# Patient Record
Sex: Male | Born: 1985 | Race: White | Hispanic: No | Marital: Married | Smoking: Former smoker
Health system: Southern US, Community
[De-identification: ages and names within clinical notes are randomized; demographics above are authoritative.]

## PROBLEM LIST (undated history)

## (undated) ENCOUNTER — Ambulatory Visit (HOSPITAL_COMMUNITY): Discharge status: Absent without leave | Payer: 59

## (undated) DIAGNOSIS — Z87891 Personal history of nicotine dependence: Secondary | ICD-10-CM

## (undated) HISTORY — DX: Personal history of nicotine dependence: Z87.891

## (undated) HISTORY — PX: TONSILLECTOMY: SUR1361

---

## 2008-11-28 ENCOUNTER — Encounter: Payer: Self-pay | Admitting: Family Medicine

## 2008-12-20 ENCOUNTER — Encounter: Payer: Self-pay | Admitting: Family Medicine

## 2009-05-10 ENCOUNTER — Emergency Department (HOSPITAL_COMMUNITY): Admission: EM | Admit: 2009-05-10 | Discharge: 2009-05-10 | Payer: Self-pay | Admitting: Emergency Medicine

## 2009-08-06 ENCOUNTER — Ambulatory Visit: Payer: Self-pay | Admitting: Family Medicine

## 2009-08-06 DIAGNOSIS — Z87891 Personal history of nicotine dependence: Secondary | ICD-10-CM

## 2009-08-06 HISTORY — DX: Personal history of nicotine dependence: Z87.891

## 2010-03-04 NOTE — Assessment & Plan Note (Signed)
Summary: To Establish//cb   Vital Signs:  Patient profile:   25 year old male Height:      71 inches (180.34 cm) Weight:      211 pounds (95.91 kg) BMI:     29.53 O2 Sat:      98 % on Room air Temp:     98.4 degrees F (36.89 degrees C) oral Pulse rate:   56 / minute BP sitting:   110 / 76  (left arm) Cuff size:   large  Vitals Entered By: Josph Macho RMA (August 06, 2009 10:11 AM)  O2 Flow:  Room air CC: Establish new pt/ CF Is Patient Diabetic? No   History of Present Illness: Here to establish care.  PMH reviewed.  Hx tonsillectomy in childhood.  No other surgery. Allergy to Ceclor.  No chronic med probems.  Recent tetanus earlier this year.  Works for [employer].  Has yearly physicals through them.  recent labs reviewed and normal. Normal Audiogram last Fall.  Needs permit for concealed gun signed.  No hx of depression or any mental health issues. No hx of drug addiction.  FH and SH reviewed.  Smokes < 1/2 pack cigs/day.  Preventive Screening-Counseling & Management  Alcohol-Tobacco     Smoking Status: current  Current Medications (verified): 1)  None  Allergies (verified): 1)  ! Ceclor  Past History:  Family History: Last updated: 08/06/2009 Family History Hypertension MGF  CAD. Father Crohn's Disease.  Social History: Last updated: 08/06/2009 Occupation:  IT sales professional Single Current Smoker Alcohol use-yes  Risk Factors: Smoking Status: current (08/06/2009)  Past Medical History: no chronic med problems.  Past Surgical History: Tonsillectomy PMH-FH-SH reviewed for relevance  Family History: Family History Hypertension MGF  CAD. Father Crohn's Disease.  Social History: Occupation:  Programme researcher, broadcasting/film/video Current Smoker Alcohol use-yes Occupation:  employed Smoking Status:  current  Review of Systems  The patient denies anorexia, fever, weight loss, weight gain, vision loss, decreased hearing, hoarseness, chest pain, syncope,  dyspnea on exertion, peripheral edema, prolonged cough, headaches, hemoptysis, abdominal pain, melena, hematochezia, severe indigestion/heartburn, hematuria, incontinence, genital sores, muscle weakness, suspicious skin lesions, transient blindness, difficulty walking, depression, unusual weight change, abnormal bleeding, enlarged lymph nodes, and testicular masses.    Physical Exam  General:  Well-developed,well-nourished,in no acute distress; alert,appropriate and cooperative throughout examination Head:  Normocephalic and atraumatic without obvious abnormalities. No apparent alopecia or balding. Ears:  External ear exam shows no significant lesions or deformities.  Otoscopic examination reveals clear canals, tympanic membranes are intact bilaterally without bulging, retraction, inflammation or discharge. Hearing is grossly normal bilaterally. Mouth:  Oral mucosa and oropharynx without lesions or exudates.  Teeth in good repair. Neck:  No deformities, masses, or tenderness noted. Lungs:  Normal respiratory effort, chest expands symmetrically. Lungs are clear to auscultation, no crackles or wheezes. Heart:  Normal rate and regular rhythm. S1 and S2 normal without gallop, murmur, click, rub or other extra sounds. Abdomen:  Bowel sounds positive,abdomen soft and non-tender without masses, organomegaly or hernias noted. Neurologic:  alert & oriented X3, cranial nerves II-XII intact, and gait normal.   Skin:  no rashes and no suspicious lesions.   Psych:  normally interactive, good eye contact, not anxious appearing, and not depressed appearing.     Impression & Recommendations:  Problem # 1:  PERS HX TOBACCO USE PRESENTING HAZARDS HEALTH (ICD-V15.82) pt counseled on need to stop tobacco use though currently low volume use.  Patient Instructions: 1)  Please schedule a  follow-up appointment as needed .    Preventive Care Screening  Last Tetanus Booster:    Date:  06/02/2009    Results:   Historical     Preventive Care Screening  Last Tetanus Booster:    Date:  06/02/2009    Results:  Historical

## 2010-03-04 NOTE — Therapy (Signed)
Summary: Audiogram Analysis/[facility]  Audiogram Analysis/[facility]   Imported By: Maryln Gottron 08/08/2009 11:20:19  _____________________________________________________________________  External Attachment:    Type:   Image     Comment:   External Document

## 2010-11-28 ENCOUNTER — Encounter: Payer: Self-pay | Admitting: Family Medicine

## 2010-11-28 ENCOUNTER — Ambulatory Visit (INDEPENDENT_AMBULATORY_CARE_PROVIDER_SITE_OTHER): Payer: 59 | Admitting: Family Medicine

## 2010-11-28 VITALS — BP 120/80 | Temp 98.1°F | Wt 214.0 lb

## 2010-11-28 DIAGNOSIS — F17200 Nicotine dependence, unspecified, uncomplicated: Secondary | ICD-10-CM

## 2010-11-28 MED ORDER — BUPROPION HCL ER (SR) 150 MG PO TB12: 150.0000 mg | ORAL_TABLET | Freq: Two times a day (BID) | ORAL | Status: DC

## 2010-11-28 NOTE — Patient Instructions (Signed)
Start Zyban one daily for one week then increase to one twice daily Stop smoking within 2 weeks after starting medication.

## 2010-11-28 NOTE — Progress Notes (Signed)
  Subjective:    Patient ID: Luis Villa, male    DOB: [birth year], 25 y.o.   MRN: [id number]  HPI  Here to discuss smoking cessation. Patient currently smokes about one pack of cigarettes per day. Has previously tried nicotine patches without much success. He is very resolute in quitting at this time. Has smoked for approximately 10 years. Works as a IT sales professional.   Wants to consider either Chantix or Zyban.  He has some reservation about risk of depression with Chantix.   Review of Systems  Respiratory: Negative for cough and shortness of breath.   Cardiovascular: Negative for chest pain.       Objective:   Physical Exam  Constitutional: He appears well-developed and well-nourished.  Cardiovascular: Normal rate and regular rhythm.   Pulmonary/Chest: Effort normal and breath sounds normal. No respiratory distress. He has no wheezes. He has no rales.          Assessment & Plan:  Smoking cessation. Discussed options with patient. To try Wellbutrin 150 mg twice daily. We'll start with once daily for one week then titrate to twice daily. Quit smoking within 2 weeks after starting. Discussed importance of picking a quit date.

## 2012-03-03 ENCOUNTER — Encounter (HOSPITAL_COMMUNITY): Payer: Self-pay | Admitting: Emergency Medicine

## 2012-03-03 ENCOUNTER — Emergency Department (HOSPITAL_COMMUNITY)
Admission: EM | Admit: 2012-03-03 | Discharge: 2012-03-03 | Disposition: A | Discharge status: Home / Self Care | Payer: Worker's Compensation | Attending: Emergency Medicine | Admitting: Emergency Medicine

## 2012-03-03 DIAGNOSIS — W208XXA Other cause of strike by thrown, projected or falling object, initial encounter: Secondary | ICD-10-CM | POA: Insufficient documentation

## 2012-03-03 DIAGNOSIS — S0990XA Unspecified injury of head, initial encounter: Secondary | ICD-10-CM | POA: Insufficient documentation

## 2012-03-03 DIAGNOSIS — Y9389 Activity, other specified: Secondary | ICD-10-CM | POA: Insufficient documentation

## 2012-03-03 DIAGNOSIS — Y929 Unspecified place or not applicable: Secondary | ICD-10-CM | POA: Insufficient documentation

## 2012-03-03 DIAGNOSIS — F17200 Nicotine dependence, unspecified, uncomplicated: Secondary | ICD-10-CM | POA: Insufficient documentation

## 2012-03-03 MED ORDER — ACETAMINOPHEN 325 MG PO TABS
650.0000 mg | ORAL_TABLET | Freq: Once | ORAL | Status: AC
  Administered 2012-03-03: 650 mg via ORAL
  Filled 2012-03-03: qty 2

## 2012-03-03 NOTE — ED Notes (Signed)
MD at bedside. [place]ver[place] MD 
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
## 2012-06-25 ENCOUNTER — Emergency Department (INDEPENDENT_AMBULATORY_CARE_PROVIDER_SITE_OTHER)
Admission: EM | Admit: 2012-06-25 | Discharge: 2012-06-25 | Disposition: A | Discharge status: Home / Self Care | Payer: Worker's Compensation | Source: Home / Self Care | Attending: Emergency Medicine | Admitting: Emergency Medicine

## 2012-06-25 ENCOUNTER — Emergency Department (INDEPENDENT_AMBULATORY_CARE_PROVIDER_SITE_OTHER): Payer: Worker's Compensation

## 2012-06-25 ENCOUNTER — Encounter (HOSPITAL_COMMUNITY): Payer: Self-pay | Admitting: Emergency Medicine

## 2012-06-25 DIAGNOSIS — IMO0002 Reserved for concepts with insufficient information to code with codable children: Secondary | ICD-10-CM

## 2012-06-25 DIAGNOSIS — S61409A Unspecified open wound of unspecified hand, initial encounter: Secondary | ICD-10-CM

## 2012-06-25 DIAGNOSIS — T148XXA Other injury of unspecified body region, initial encounter: Secondary | ICD-10-CM

## 2012-06-25 DIAGNOSIS — S61411A Laceration without foreign body of right hand, initial encounter: Secondary | ICD-10-CM

## 2012-06-25 MED ORDER — OXYCODONE-ACETAMINOPHEN 5-325 MG PO TABS: ORAL_TABLET | ORAL | Status: DC

## 2012-06-25 MED ORDER — ONDANSETRON 4 MG PO TBDP
ORAL_TABLET | ORAL | Status: AC
  Filled 2012-06-25: qty 1

## 2012-06-25 MED ORDER — HYDROCODONE-ACETAMINOPHEN 5-325 MG PO TABS
ORAL_TABLET | ORAL | Status: AC
  Filled 2012-06-25: qty 2

## 2012-06-25 MED ORDER — HYDROMORPHONE HCL PF 1 MG/ML IJ SOLN
INTRAMUSCULAR | Status: AC
  Filled 2012-06-25: qty 2

## 2012-06-25 MED ORDER — HYDROCODONE-ACETAMINOPHEN 5-325 MG PO TABS
2.0000 | ORAL_TABLET | Freq: Once | ORAL | Status: AC
  Administered 2012-06-25: 2 via ORAL

## 2012-06-25 MED ORDER — ONDANSETRON HCL 4 MG/2ML IJ SOLN: 4.0000 mg | Freq: Once | INTRAMUSCULAR | Status: DC

## 2012-06-25 MED ORDER — CEPHALEXIN 500 MG PO CAPS: 500.0000 mg | ORAL_CAPSULE | Freq: Three times a day (TID) | ORAL | Status: DC

## 2012-06-25 MED ORDER — HYDROMORPHONE HCL PF 1 MG/ML IJ SOLN: 2.0000 mg | Freq: Once | INTRAMUSCULAR | Status: DC
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
## 2012-06-25 NOTE — Progress Notes (Signed)
Orthopedic Tech Progress Note Patient Details:  Calton Harshfield Feb 24, 1985 [id number]  Ortho Devices Type of Ortho Device: Ace wrap;Volar splint Ortho Device/Splint Location: right hand Ortho Device/Splint Interventions: Application   Nikki Dom 06/25/2012, 4:43 PM

## 2012-06-25 NOTE — ED Notes (Signed)
Ortho tech currently in room 3.  Patient made aware

## 2012-06-25 NOTE — ED Notes (Addendum)
Pt report laceration to right hand about 20 min ago. Pt states that during training he put his hand through a window. Wound is dressed and bleeding is controlled. Laceration to posterior port of hand above wrist.

## 2013-09-26 IMAGING — CR DG HAND COMPLETE 3+V*R*
3 series · 3 of 3 positions shown · non-contrast
Comparison: None.

CLINICAL DATA: Extremity laceration.  Pain and through window.
Laceration at the base of the fifth metacarpal.  Unable to
straighten fingers secondary to tendon damage.  Rule out foreign
body.

RIGHT HAND - COMPLETE 3+ VIEW

[view not recorded (1 of 3)]
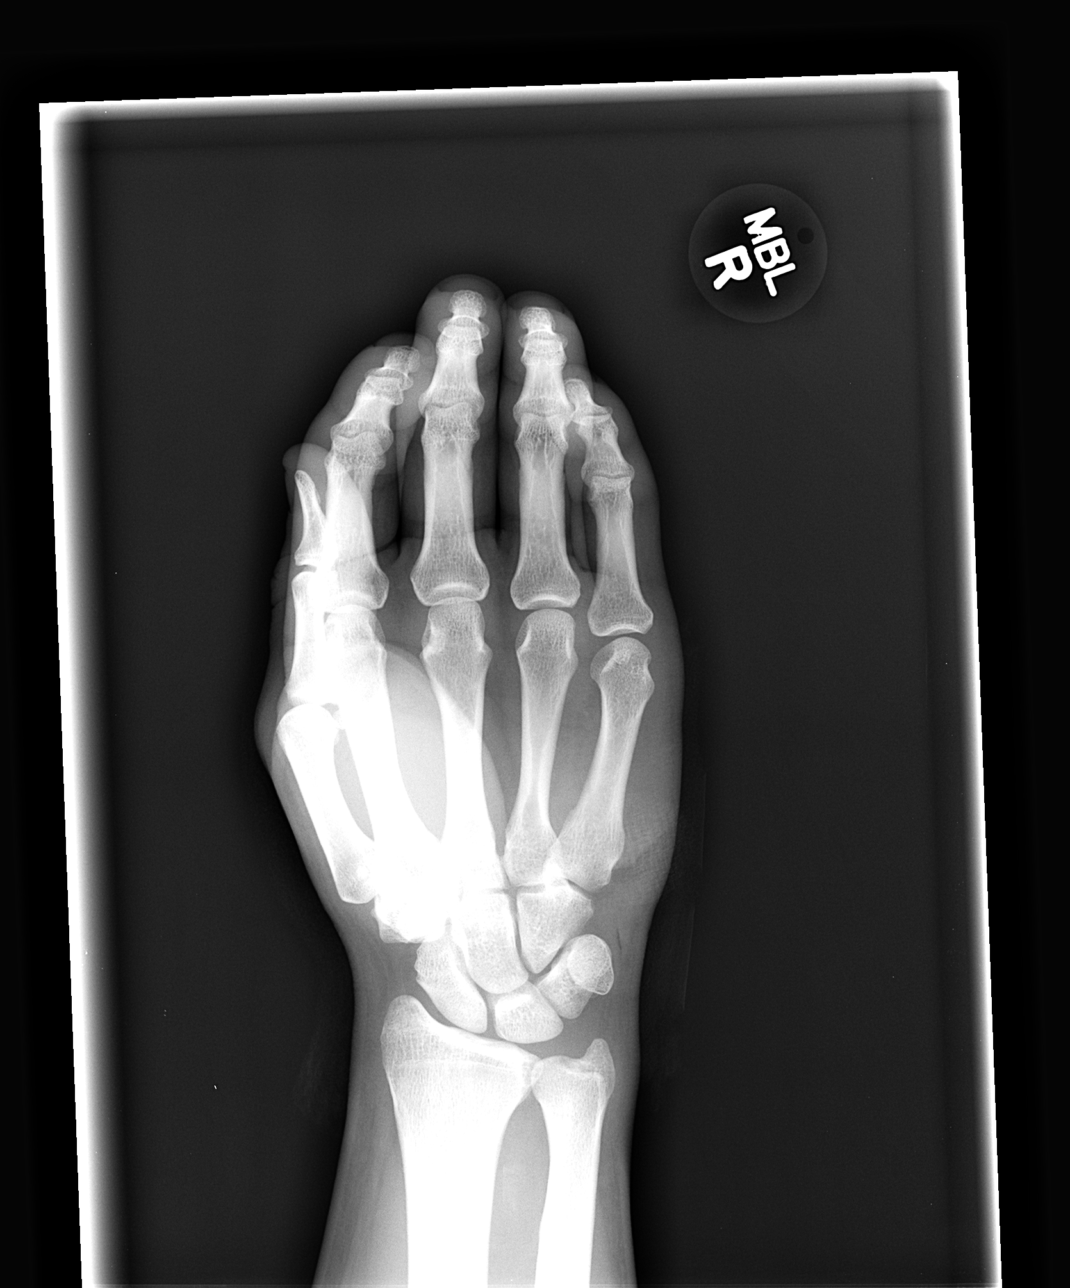

[view not recorded (2 of 3)]
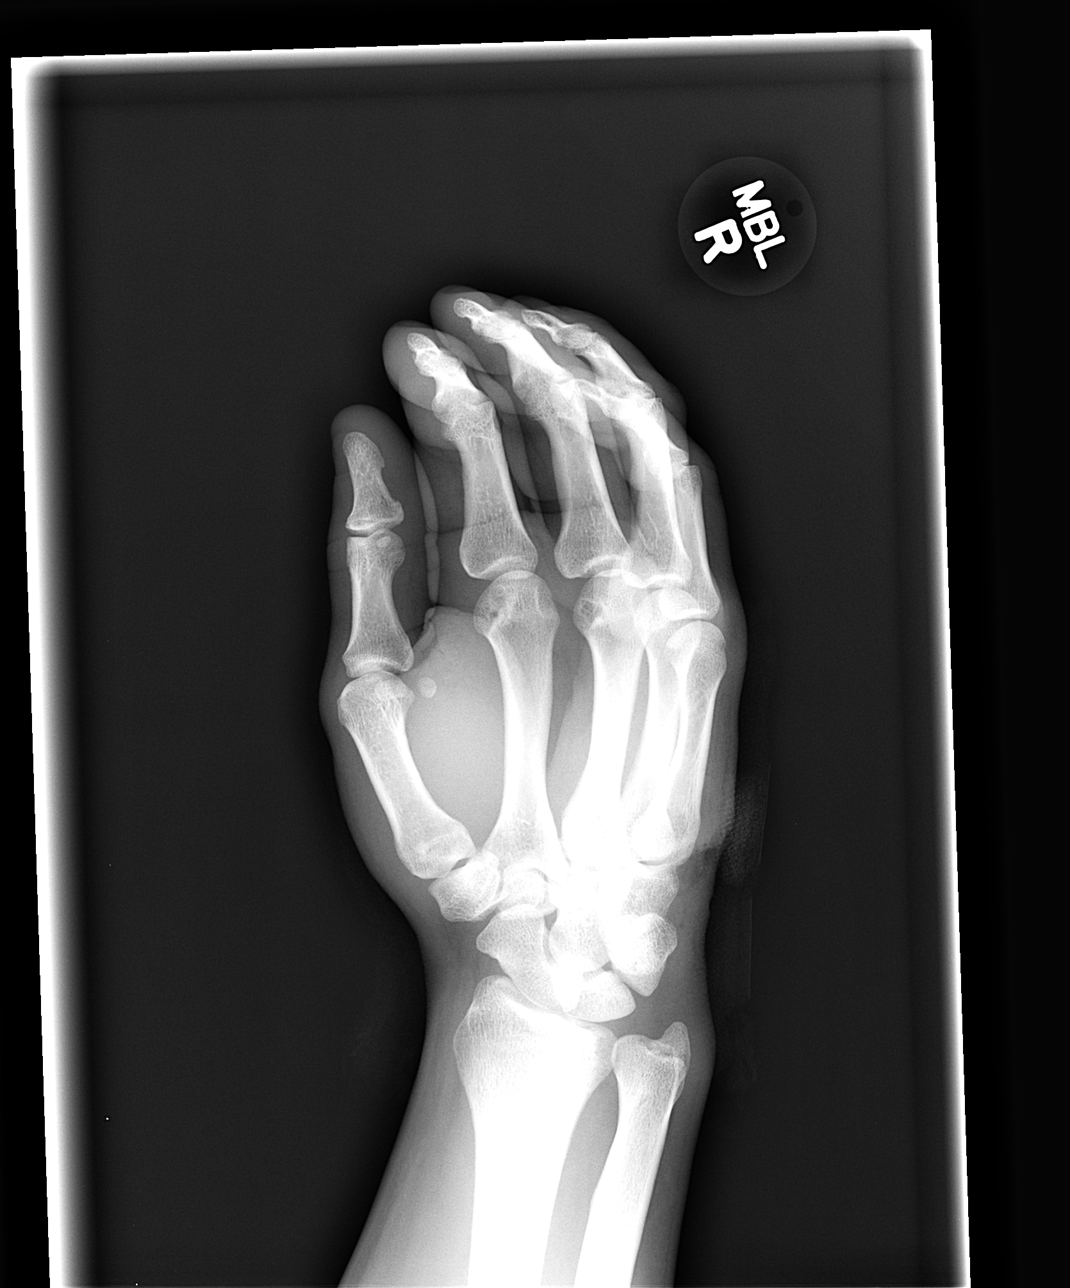

[view not recorded (3 of 3)]
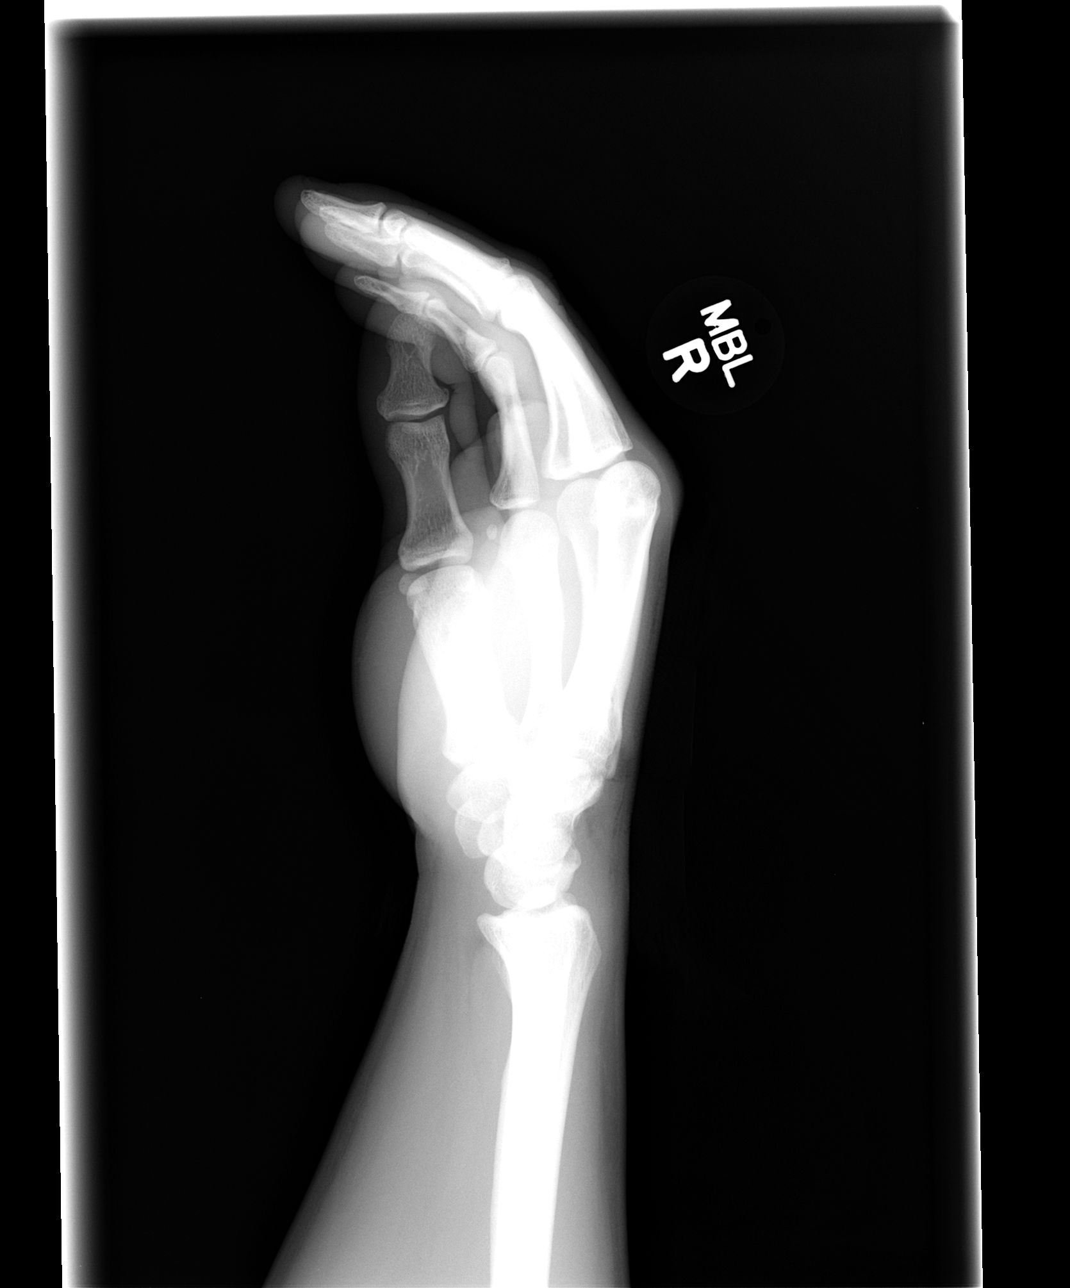

[3 of 3 positions shown; findings below may reference images not displayed]

FINDINGS: There is no evidence for acute fracture or subluxation.
No radiopaque foreign body.  There is soft tissue gas along the
posterior aspect of the fifth carpometacarpal joint.  Dressing
material is seen in this region.
IMPRESSION: 1.  Posterior laceration.
2.  No evidence for acute fracture.

## 2013-09-28 ENCOUNTER — Encounter: Payer: Self-pay | Admitting: Family Medicine

## 2013-09-28 ENCOUNTER — Ambulatory Visit (INDEPENDENT_AMBULATORY_CARE_PROVIDER_SITE_OTHER): Payer: 59 | Admitting: Family Medicine

## 2013-09-28 VITALS — BP 132/80 | HR 60 | Wt 221.0 lb

## 2013-09-28 DIAGNOSIS — L600 Ingrowing nail: Secondary | ICD-10-CM

## 2013-09-28 NOTE — Patient Instructions (Signed)
Keep toe dry for today Starting tomorrow, clean daily with soap and water Use topical antibiotic daily for next 2-3 days Follow up for signs of infection such as redness or drainage.

## 2013-09-28 NOTE — Progress Notes (Signed)
Pre visit review using our clinic review tool, if applicable. No additional management support is needed unless otherwise documented below in the visit note. 

## 2013-09-28 NOTE — Progress Notes (Addendum)
   Subjective:    Patient ID: Luis Villa, male    DOB: 23-Mar-1985, 28 y.o.   MRN: [id number]  HPI  ingrown right great toenail. Symptomatic for about one year..  he does also angulating as a IT sales professional. This is becoming daily painful. Try warm soaks without improvement. He would like to have some sort of intervention. No fevers or chills.  Past Medical History  Diagnosis Date  . PERS HX TOBACCO USE PRESENTING HAZARDS HEALTH 08/06/2009   Past Surgical History  Procedure Laterality Date  . Tonsillectomy      reports that he has quit smoking. His smoking use included Cigarettes. He has a 10 pack-year smoking history. He does not have any smokeless tobacco history on file. He reports that he does not use illicit drugs. His alcohol history is not on file. family history includes Crohn's disease in his father; Heart disease in his maternal grandfather; Hypertension in his other. Allergies  Allergen Reactions  . Cefaclor     Ceclor= hives     Review of Systems  Constitutional: Negative for fever and chills.       Objective:   Physical Exam  Constitutional: He appears well-developed and well-nourished.  Cardiovascular: Normal rate and regular rhythm.   Skin:  Right great toe reveals ingrown border next is second toe. He has some inflammation and no granulation tissue. Mild erythema          Assessment & Plan:  Ingrown right great nail. We discussed risk and benefits of partial excision. Patient consented. Digital block with 1% plain Xylocaine. After achieving block prepped with Betadine. Using straight hemostats freed up nail border. Removed about one third of nail with surgical scissors without difficulty. Minimal bleeding. Antibiotic and dressing applied. Wound care instruction given

## 2014-07-13 ENCOUNTER — Ambulatory Visit (INDEPENDENT_AMBULATORY_CARE_PROVIDER_SITE_OTHER): Payer: 59 | Admitting: Family Medicine

## 2014-07-13 ENCOUNTER — Encounter: Payer: Self-pay | Admitting: Family Medicine

## 2014-07-13 VITALS — BP 100/60 | HR 60 | Temp 98.3°F | Wt 223.7 lb

## 2014-07-13 DIAGNOSIS — S30860A Insect bite (nonvenomous) of lower back and pelvis, initial encounter: Secondary | ICD-10-CM | POA: Diagnosis not present

## 2014-07-13 DIAGNOSIS — W57XXXA Bitten or stung by nonvenomous insect and other nonvenomous arthropods, initial encounter: Secondary | ICD-10-CM | POA: Diagnosis not present

## 2014-07-13 MED ORDER — DOXYCYCLINE HYCLATE 100 MG PO TABS: 100.0000 mg | ORAL_TABLET | Freq: Two times a day (BID) | ORAL | Status: DC

## 2014-07-13 NOTE — Patient Instructions (Signed)
Tick Bite Information Ticks are insects that attach themselves to the skin and draw blood for food. There are various types of ticks. Common types include wood ticks and deer ticks. Most ticks live in shrubs and grassy areas. Ticks can climb onto your body when you make contact with leaves or grass where the tick is waiting. The most common places on the body for ticks to attach themselves are the scalp, neck, armpits, waist, and groin. Most tick bites are harmless, but sometimes ticks carry germs that cause diseases. These germs can be spread to a person during the tick's feeding process. The chance of a disease spreading through a tick bite depends on:   The type of tick.  Time of year.   How long the tick is attached.   Geographic location.  HOW CAN YOU PREVENT TICK BITES? Take these steps to help prevent tick bites when you are outdoors:  Wear protective clothing. Long sleeves and long pants are best.   Wear white clothes so you can see ticks more easily.  Tuck your pant legs into your socks.   If walking on a trail, stay in the middle of the trail to avoid brushing against bushes.  Avoid walking through areas with long grass.  Put insect repellent on all exposed skin and along boot tops, pant legs, and sleeve cuffs.   Check clothing, hair, and skin repeatedly and before going inside.   Brush off any ticks that are not attached.  Take a shower or bath as soon as possible after being outdoors.  WHAT IS THE PROPER WAY TO REMOVE A TICK? Ticks should be removed as soon as possible to help prevent diseases caused by tick bites. 1. If latex gloves are available, put them on before trying to remove a tick.  2. Using fine-point tweezers, grasp the tick as close to the skin as possible. You may also use curved forceps or a tick removal tool. Grasp the tick as close to its head as possible. Avoid grasping the tick on its body. 3. Pull gently with steady upward pressure until  the tick lets go. Do not twist the tick or jerk it suddenly. This may break off the tick's head or mouth parts. 4. Do not squeeze or crush the tick's body. This could force disease-carrying fluids from the tick into your body.  5. After the tick is removed, wash the bite area and your hands with soap and water or other disinfectant such as alcohol. 6. Apply a small amount of antiseptic cream or ointment to the bite site.  7. Wash and disinfect any instruments that were used.  Do not try to remove a tick by applying a hot match, petroleum jelly, or fingernail polish to the tick. These methods do not work and may increase the chances of disease being spread from the tick bite.  WHEN SHOULD YOU SEEK MEDICAL CARE? Contact your health care provider if you are unable to remove a tick from your skin or if a part of the tick breaks off and is stuck in the skin.  After a tick bite, you need to be aware of signs and symptoms that could be related to diseases spread by ticks. Contact your health care provider if you develop any of the following in the days or weeks after the tick bite:  Unexplained fever.  Rash. A circular rash that appears days or weeks after the tick bite may indicate the possibility of Lyme disease. The rash may resemble   a target with a bull's-eye and may occur at a different part of your body than the tick bite.  Redness and swelling in the area of the tick bite.   Tender, swollen lymph glands.   Diarrhea.   Weight loss.   Cough.   Fatigue.   Muscle, joint, or bone pain.   Abdominal pain.   Headache.   Lethargy or a change in your level of consciousness.  Difficulty walking or moving your legs.   Numbness in the legs.   Paralysis.  Shortness of breath.   Confusion.   Repeated vomiting.  Document Released: 01/17/2000 Document Revised: 11/09/2012 Document Reviewed: 06/29/2012 [employer] Patient Information 2015 [employer]. This information is  not intended to replace advice given to you by your health care provider. Make sure you discuss any questions you have with your health care provider.  

## 2014-07-13 NOTE — Progress Notes (Signed)
Pre visit review using our clinic review tool, if applicable. No additional management support is needed unless otherwise documented below in the visit note. 

## 2014-07-13 NOTE — Progress Notes (Signed)
   Subjective:    Patient ID: Luis Villa, male    DOB: Aug 05, 1985, 29 y.o.   MRN: [id number]  HPI Patient seen for the following issues  Tick bite 2-3 days ago left upper back. He is not sure whether he removed tick in entirety. He is not sure whether this was a deer tick or larger tick. He has not had any headaches, arthralgias, or any fever or chills. Tick bite occurred in the middle of a large tattoo. He's had some redness and itching and mild sensitivity.  Second issue is recurrent ingrown toenail. This was removed by partial excision couple years ago but continues to recur. He would like to consider removal with nail matrix ablation.  Past Medical History  Diagnosis Date  . PERS HX TOBACCO USE PRESENTING HAZARDS HEALTH 08/06/2009   Past Surgical History  Procedure Laterality Date  . Tonsillectomy      reports that he has quit smoking. His smoking use included Cigarettes. He has a 10 pack-year smoking history. He does not have any smokeless tobacco history on file. He reports that he does not use illicit drugs. His alcohol history is not on file. family history includes Crohn's disease in his father; Heart disease in his maternal grandfather; Hypertension in his other. Allergies  Allergen Reactions  . Cefaclor     Ceclor= hives      Review of Systems  Constitutional: Negative for fever and chills.  Musculoskeletal: Negative for arthralgias.  Skin: Positive for rash.  Neurological: Negative for headaches.       Objective:   Physical Exam  Constitutional: He appears well-developed and well-nourished.  Cardiovascular: Normal rate and regular rhythm.   Skin:  Left upper back in the middle of the tattoo he has eschar about 1/2 cm region. Just superior to this he has a couple of pustules. Approximately 3 cm surrounding zone of erythema. No evidence for erythema migrans. Slightly warm to touch. Nontender          Assessment & Plan:  Recent tick bite with probable  local allergic reaction. He does have a couple pustules in this vicinity. Cannot see any obvious retained tick parts but he has eschar.  Cannot rule out early cellulitis changes. Cover with doxycycline 100 mg twice daily for 10 days. Cautioned about sun sensitivity.  Recurrent ingrown toenail. Patient will schedule follow-up with podiatrist

## 2014-07-20 ENCOUNTER — Ambulatory Visit (INDEPENDENT_AMBULATORY_CARE_PROVIDER_SITE_OTHER): Payer: 59 | Admitting: Podiatry

## 2014-07-20 ENCOUNTER — Encounter: Payer: Self-pay | Admitting: Podiatry

## 2014-07-20 VITALS — BP 125/68 | HR 65 | Ht 71.0 in | Wt 215.0 lb

## 2014-07-20 DIAGNOSIS — L600 Ingrowing nail: Secondary | ICD-10-CM

## 2014-07-20 DIAGNOSIS — M79673 Pain in unspecified foot: Secondary | ICD-10-CM | POA: Diagnosis not present

## 2014-07-20 DIAGNOSIS — M79602 Pain in left arm: Secondary | ICD-10-CM

## 2014-07-20 DIAGNOSIS — M79604 Pain in right leg: Secondary | ICD-10-CM | POA: Insufficient documentation

## 2014-07-20 DIAGNOSIS — M79605 Pain in left leg: Secondary | ICD-10-CM

## 2014-07-20 NOTE — Patient Instructions (Signed)
Ingrown nail surgery was done on left great toe lateral border. Follow soaking instruction.  Some redness and drainage is expected. Call the office if the area gets feverish with increased redness and drainage.  

## 2014-07-20 NOTE — Progress Notes (Signed)
Subjective: 29 year old male presents complaining of painful chronic recurring ingrown nail on left great toe lateral border. Has had nail resection done in past and still grows in. Patient is seeking permanent resolution of the problem.  Review of Systems - Negative except chronic ingrown nail problem.   Objective: Dermatologic: Ingrown nail left great toe lateral border without infection or drainage. No open skin lesions noted. Vascular: All pedal pulses are palpable. No edema or erythema noted. Neurologic: All epicritic and tactile sensations grossly intact. Orthopedic: No gross deformities noted.   Assessment: Chronic ingrown nail left hallux lateral border with pain.  Plan:  Reviewed clinical findings and available treatment options. Patient requests surgical option.  Affected left great toe was anesthetized with total 20ml mixture of 50/50 0.5% Marcaine plain and 1% Xylocaine plain. Affected lateral ail border was reflected with a nail elevator and excised with nail nipper. Proximal nail matrix tissue was cauterized with Phenol soaked cotton applicator x 4 and neutralized with Alcohol soaked cotton applicator. The wound was dressed with Amerigel ointment dressing. Home care instructions and supply dispensed.  Return in 1 week for follow up.

## 2014-07-25 ENCOUNTER — Ambulatory Visit (INDEPENDENT_AMBULATORY_CARE_PROVIDER_SITE_OTHER): Payer: 59 | Admitting: Podiatry

## 2014-07-25 ENCOUNTER — Encounter: Payer: Self-pay | Admitting: Podiatry

## 2014-07-25 DIAGNOSIS — L600 Ingrowing nail: Secondary | ICD-10-CM

## 2014-07-25 NOTE — Progress Notes (Signed)
1 week post nail surgery. No complaints. Surgical site is dry without any erythema or drainage.  Continue to use Band aid during the day.

## 2014-07-25 NOTE — Patient Instructions (Signed)
Good wound healing following nail surgery. Continue with band aid during the day. Return as needed.

## 2016-04-13 ENCOUNTER — Ambulatory Visit (INDEPENDENT_AMBULATORY_CARE_PROVIDER_SITE_OTHER): Payer: Commercial Managed Care - HMO | Admitting: Podiatry

## 2016-04-13 ENCOUNTER — Encounter: Payer: Self-pay | Admitting: Podiatry

## 2016-04-13 VITALS — Ht 71.0 in | Wt 225.0 lb

## 2016-04-13 DIAGNOSIS — L03032 Cellulitis of left toe: Secondary | ICD-10-CM

## 2016-04-13 DIAGNOSIS — L600 Ingrowing nail: Secondary | ICD-10-CM | POA: Diagnosis not present

## 2016-04-13 NOTE — Progress Notes (Signed)
Subjective: 31 year old male presents complaining of painful ingrown nail on left great toe at medial border and requests for surgical correction. 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 On feet most of the day.   He has had left great toe lateral border done on 07/20/14 for the same problem.   Objective: Dermatologic: Ingrown nail left great toe medial border with inflamed ungual labia. Vascular: All pedal pulses are palpable. No edema or erythema noted. Neurologic: All epicritic and tactile sensations grossly intact. Orthopedic: No gross deformities noted.   Assessment: Chronic ingrown nail left hallux medial border with pain.  Plan:  Reviewed clinical findings and available treatment options. Patient requests surgical option.  Affected left great toe was anesthetized with total 5ml mixture of 50/50 0.5% Marcaine plain and 1% Xylocaine plain. Affected left great toe medial border was reflected with a nail elevator and excised with nail nipper. Proximal nail matrix tissue was cauterized with Phenol soaked cotton applicator x 4 and neutralized with Alcohol soaked cotton applicator. The wound was dressed with Amerigel ointment dressing. Home care instructions and supply dispensed.  Return in 1 week for follow up.

## 2016-04-13 NOTE — Patient Instructions (Signed)
Ingrown nail surgery was done on left great toe medial border. Follow soaking instruction.  Some redness and drainage is expected. Call the office if the area gets feverish with increased redness and drainage. Return in one week.  

## 2016-04-21 ENCOUNTER — Encounter: Payer: 59 | Admitting: Podiatry

## 2018-02-04 ENCOUNTER — Ambulatory Visit (INDEPENDENT_AMBULATORY_CARE_PROVIDER_SITE_OTHER): Payer: 59

## 2018-02-04 ENCOUNTER — Encounter: Payer: Self-pay | Admitting: Podiatry

## 2018-02-04 ENCOUNTER — Other Ambulatory Visit: Payer: Self-pay | Admitting: Podiatry

## 2018-02-04 ENCOUNTER — Ambulatory Visit: Payer: 59 | Admitting: Podiatry

## 2018-02-04 VITALS — BP 123/73 | HR 52

## 2018-02-04 DIAGNOSIS — M722 Plantar fascial fibromatosis: Secondary | ICD-10-CM

## 2018-02-04 DIAGNOSIS — M79672 Pain in left foot: Secondary | ICD-10-CM

## 2018-02-04 MED ORDER — DICLOFENAC SODIUM 75 MG PO TBEC: 75.0000 mg | DELAYED_RELEASE_TABLET | Freq: Two times a day (BID) | ORAL | 1 refills | Status: DC

## 2018-02-04 MED ORDER — METHYLPREDNISOLONE 4 MG PO TBPK: ORAL_TABLET | ORAL | 0 refills | Status: DC

## 2018-02-07 NOTE — Progress Notes (Signed)
   Subjective: 33 year old male presenting today as a new patient with a chief complaint of sharp pain to the mid arch and posterior left heel that began a few years ago. He states the pain is worse in the morning time when he first gets out of bed or when he tries to stand after being seated for a long period of time. 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 He has not done anything for treatment. Patient is here for further evaluation and treatment.   Past Medical History:  Diagnosis Date  . PERS HX TOBACCO USE PRESENTING HAZARDS HEALTH 08/06/2009     Objective: Physical Exam General: The patient is alert and oriented x3 in no acute distress.  Dermatology: Skin is warm, dry and supple bilateral lower extremities. Negative for open lesions or macerations bilateral.   Vascular: Dorsalis Pedis and Posterior Tibial pulses palpable bilateral.  Capillary fill time is immediate to all digits.  Neurological: Epicritic and protective threshold intact bilateral.   Musculoskeletal: Tenderness to palpation to the plantar aspect of the left heel along the plantar fascia. All other joints range of motion within normal limits bilateral. Strength 5/5 in all groups bilateral.   Radiographic exam:   Normal osseous mineralization. Joint spaces preserved. No fracture/dislocation/boney destruction. No other soft tissue abnormalities or radiopaque foreign bodies.   Assessment: 1. Plantar fasciitis left foot  Plan of Care:  1. Patient evaluated. Xrays reviewed.   2. Injection of 0.5cc Celestone soluspan injected into the left plantar fascia.  3. Rx for Medrol Dose Pak placed 4. Rx for Diclofenac ordered for patient. 5. Plantar fascial band(s) dispensed  6. Instructed patient regarding therapies and modalities at home to alleviate symptoms.  7. Authorization for custom orthotics requested.  8. Return to clinic in 4 weeks.     Felecia Shelling, DPM [facility]  Dr. Felecia Shelling, DPM    [address] [place], [place] [address]                Office [phone]  Fax [phone]

## 2018-02-23 ENCOUNTER — Ambulatory Visit: Payer: 59 | Admitting: Podiatry

## 2018-03-04 ENCOUNTER — Ambulatory Visit: Payer: 59 | Admitting: Podiatry

## 2018-03-30 ENCOUNTER — Other Ambulatory Visit: Payer: Self-pay | Admitting: Podiatry

## 2018-05-10 ENCOUNTER — Encounter: Payer: Self-pay | Admitting: Podiatry

## 2018-05-10 ENCOUNTER — Ambulatory Visit (INDEPENDENT_AMBULATORY_CARE_PROVIDER_SITE_OTHER): Payer: 59 | Admitting: Podiatry

## 2018-05-10 ENCOUNTER — Other Ambulatory Visit: Payer: Self-pay

## 2018-05-10 VITALS — Temp 97.7°F

## 2018-05-10 DIAGNOSIS — M722 Plantar fascial fibromatosis: Secondary | ICD-10-CM | POA: Diagnosis not present

## 2018-05-10 MED ORDER — DICLOFENAC SODIUM 75 MG PO TBEC: 75.0000 mg | DELAYED_RELEASE_TABLET | Freq: Two times a day (BID) | ORAL | 0 refills | Status: DC

## 2018-05-10 MED ORDER — METHYLPREDNISOLONE 4 MG PO TBPK: ORAL_TABLET | ORAL | 0 refills | Status: DC

## 2018-05-10 NOTE — Patient Instructions (Signed)
Insoles/arch supports: Geographical information systems officer or powerstep

## 2018-05-11 NOTE — Progress Notes (Signed)
   Subjective: Patient presents today for follow-up treatment evaluation regarding left heel pain/plantar fasciitis.  Patient was last seen as a new patient on 02/04/2018.  At that time injection was performed with a Medrol Dosepak and diclofenac which was prescribed.  Patient says that he was better for about a month.  Pain is recurred and is currently back to the original pain.  He has been dealing with this for the past several years despite conservative modalities to help alleviate symptoms.   Past Medical History:  Diagnosis Date  . PERS HX TOBACCO USE PRESENTING HAZARDS HEALTH 08/06/2009     Objective: Physical Exam General: The patient is alert and oriented x3 in no acute distress.  Dermatology: Skin is warm, dry and supple bilateral lower extremities. Negative for open lesions or macerations bilateral.   Vascular: Dorsalis Pedis and Posterior Tibial pulses palpable bilateral.  Capillary fill time is immediate to all digits.  Neurological: Epicritic and protective threshold intact bilateral.   Musculoskeletal: Tenderness to palpation to the plantar aspect of the left heel along the plantar fascia. All other joints range of motion within normal limits bilateral. Strength 5/5 in all groups bilateral.    Assessment: 1. Plantar fasciitis left foot  Plan of Care:  1. Patient evaluated.  Since it has been approximately 3 months since the patient's last treatment we can go ahead and do the original treatment again to see if it alleviates symptoms. 2. Injection of 0.5cc Celestone soluspan injected into the left plantar fascia.  3. Rx for Medrol Dose Pak placed 4.  Refill Rx for Diclofenac ordered for patient. 5.  Continue plantar fascial brace 6. Instructed patient regarding therapies and modalities at home to alleviate symptoms.  7. Authorization for custom orthotics was requested however insurance does not cover orthotics.  Recommend good supportive over-the-counter arch supports 8.  Return to clinic in 4 weeks.     Felecia Shelling, DPM [facility]  Dr. Felecia Shelling, DPM    [address] [place], [place] [address]                Office [phone]  Fax [phone]

## 2018-06-07 ENCOUNTER — Ambulatory Visit: Payer: 59 | Admitting: Podiatry

## 2018-06-10 ENCOUNTER — Encounter: Payer: Self-pay | Admitting: Podiatry

## 2018-06-10 ENCOUNTER — Ambulatory Visit (INDEPENDENT_AMBULATORY_CARE_PROVIDER_SITE_OTHER): Payer: 59 | Admitting: Podiatry

## 2018-06-10 ENCOUNTER — Other Ambulatory Visit: Payer: Self-pay

## 2018-06-10 VITALS — Temp 97.2°F

## 2018-06-10 DIAGNOSIS — M722 Plantar fascial fibromatosis: Secondary | ICD-10-CM

## 2018-06-28 NOTE — Progress Notes (Signed)
   Subjective: Patient presents today for follow-up treatment evaluation regarding left heel pain/plantar fasciitis.  Patient says that he is feeling much better.  He currently has no pain in his left foot.  He did get a new pair of boots which is been helping significantly.  He is no longer using the plantar fascial brace.   Past Medical History:  Diagnosis Date  . PERS HX TOBACCO USE PRESENTING HAZARDS HEALTH 08/06/2009     Objective: Physical Exam General: The patient is alert and oriented x3 in no acute distress.  Dermatology: Skin is warm, dry and supple bilateral lower extremities. Negative for open lesions or macerations bilateral.   Vascular: Dorsalis Pedis and Posterior Tibial pulses palpable bilateral.  Capillary fill time is immediate to all digits.  Neurological: Epicritic and protective threshold intact bilateral.   Musculoskeletal: Negative for tenderness to palpation to the plantar aspect of the left heel along the plantar fascia. All other joints range of motion within normal limits bilateral. Strength 5/5 in all groups bilateral.    Assessment: 1. Plantar fasciitis left foot-resolved  Plan of Care:  1. Patient evaluated.   2.  Continue plantar fascial brace as needed 3.  Continue wearing Randel Pigg work boots with arch supports 4.  Continue diclofenac 75 mg as needed 5.  Return to clinic as needed   Felecia Shelling, DPM [facility]  Dr. Felecia Shelling, DPM    [address] [place], [place] [address]                Office [phone]  Fax [phone]

## 2018-07-09 ENCOUNTER — Other Ambulatory Visit: Payer: Self-pay | Admitting: Podiatry

## 2018-09-13 ENCOUNTER — Other Ambulatory Visit (INDEPENDENT_AMBULATORY_CARE_PROVIDER_SITE_OTHER): Payer: Self-pay | Admitting: Podiatry

## 2018-09-13 MED ORDER — DICLOFENAC SODIUM 75 MG PO TBEC: 75.0000 mg | DELAYED_RELEASE_TABLET | Freq: Two times a day (BID) | ORAL | 1 refills | Status: DC

## 2018-09-13 MED ORDER — METHYLPREDNISOLONE 4 MG PO TBPK: ORAL_TABLET | ORAL | 0 refills | Status: DC

## 2018-09-13 NOTE — Progress Notes (Signed)
PRN foot pain 

## 2019-02-06 ENCOUNTER — Other Ambulatory Visit: Payer: Self-pay | Admitting: Podiatry

## 2021-06-13 ENCOUNTER — Telehealth: Payer: Self-pay | Admitting: Family Medicine

## 2021-06-13 NOTE — Telephone Encounter (Signed)
Patient would like to know if he can be accepted back on as a new patient. Last seen in 2016 ? ? ? ? ? ? ?Please advise  ?

## 2021-06-24 ENCOUNTER — Encounter: Payer: Self-pay | Admitting: Family Medicine

## 2021-06-24 ENCOUNTER — Ambulatory Visit: Payer: 59 | Admitting: Family Medicine

## 2021-06-24 VITALS — BP 114/70 | HR 53 | Temp 98.0°F | Ht 70.0 in | Wt 220.0 lb

## 2021-06-24 DIAGNOSIS — F988 Other specified behavioral and emotional disorders with onset usually occurring in childhood and adolescence: Secondary | ICD-10-CM

## 2021-06-24 MED ORDER — AMPHETAMINE-DEXTROAMPHET ER 20 MG PO CP24: 20.0000 mg | ORAL_CAPSULE | ORAL | 0 refills | Status: DC

## 2021-06-24 NOTE — Progress Notes (Signed)
Established Patient Office Visit  Subjective   Patient ID: Luis Villa, male    DOB: 08-Sep-1985  Age: 36 y.o. MRN: [id number]  Chief Complaint  Patient presents with   Annual Exam    HPI   Luis Villa is seen to reestablish care.  Generally very healthy.  Takes no regular medications.  He is married and has 2 children ages 3-4.  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  His concern is that he feels like he is having increasing difficulty with focus especially with multitasking. Sometimes feels agitated with difficulty focusing. Denies any depression symptoms.  He is staying very engaged in hobbies such as fishing.  Still exercises a few days per week.  No recent alcohol.  Frequently feels like he is in a "fog".    He states he had difficulty focusing as long as he can remember and especially in school.  Easy distractibility through his school years.  Past Medical History:  Diagnosis Date   PERS HX TOBACCO USE PRESENTING HAZARDS HEALTH 08/06/2009   Past Surgical History:  Procedure Laterality Date   TONSILLECTOMY      reports that he has quit smoking. His smoking use included cigarettes. He has a 10.00 pack-year smoking history. He has never used smokeless tobacco. He reports that he does not use drugs. No history on file for alcohol use. family history includes Crohn's disease in his father; Heart disease in his maternal grandfather; Hypertension in an other family member. Allergies  Allergen Reactions   Cefaclor     Ceclor= hives    Review of Systems  Constitutional:  Negative for weight loss.  Respiratory:  Negative for shortness of breath.   Cardiovascular:  Negative for chest pain.  Neurological:  Negative for dizziness.  Psychiatric/Behavioral:  Negative for depression and memory loss.      Objective:     BP 114/70 (BP Location: Left Arm, Patient Position: Sitting, Cuff Size: Normal)   Pulse (!) 53   Temp  98 F (36.7 C) (Oral)   Ht 5\' 10"  (1.778 m)   Wt 220 lb (99.8 kg)   SpO2 100%   BMI 31.57 kg/m  BP Readings from Last 3 Encounters:  06/24/21 114/70  02/04/18 123/73  07/20/14 125/68      Physical Exam Vitals reviewed.  Constitutional:      Appearance: Normal appearance.  Cardiovascular:     Rate and Rhythm: Normal rate and regular rhythm.     Heart sounds: No murmur heard.   No gallop.  Pulmonary:     Effort: Pulmonary effort is normal.     Breath sounds: Normal breath sounds.  Neurological:     General: No focal deficit present.     Mental Status: He is alert.  Psychiatric:        Mood and Affect: Mood normal.        Thought Content: Thought content normal.     No results found for any visits on 06/24/21.    The ASCVD Risk score (Arnett DK, et al., 2019) failed to calculate for the following reasons:   The 2019 ASCVD risk score is only valid for ages 22 to 62    Assessment & Plan:   Probable adult ADD.  We did discuss issue of formal testing with PhD psychologist with expertise in this area.  However, he is checked on this and would be several months delayed.  He feels like  his current attention issues are having significant impact on his current life and work.  Strong suspicion clinically for adult ADD after discussing his history.  We decided to go ahead with trial of Adderall extended release 20 mg once daily.  Reviewed potential side effects.  We have asked that he give [place] some feedback in 1 month.  If he is not seeing substantial improvement with this certainly consider full ADD assessment.  He was also given brochure for our behavioral health division to consider setting up evaluation  30 minutes spent assessing his history and discussing attention deficit disorder and potential treatments including both nonstimulant and stimulant options  No follow-ups on file.    Evelena Peat, MD

## 2021-07-04 ENCOUNTER — Telehealth: Payer: Self-pay | Admitting: Family Medicine

## 2021-07-04 NOTE — Telephone Encounter (Signed)
Pt states he was supposed to call Dr. Caryl Never with an update on his medication--Adderall.  Pt states it is working out well, however he thinks it may need to be increased some.    Wants to see if he can get a 3 month supply.    Pharmacy- [facility] in [place]

## 2021-07-05 MED ORDER — AMPHETAMINE-DEXTROAMPHET ER 30 MG PO CP24: 30.0000 mg | ORAL_CAPSULE | ORAL | 0 refills | Status: DC

## 2021-07-05 NOTE — Telephone Encounter (Signed)
My suggestion is that we try one month prescription of the increased dose (30 mg ) to see how that works and give feedback in one month.

## 2021-07-07 NOTE — Telephone Encounter (Signed)
Pt informed of the message and verbalized understanding  

## 2021-07-16 ENCOUNTER — Encounter: Payer: Self-pay | Admitting: Psychology

## 2021-07-16 ENCOUNTER — Ambulatory Visit (INDEPENDENT_AMBULATORY_CARE_PROVIDER_SITE_OTHER): Payer: 59 | Admitting: Psychology

## 2021-07-16 DIAGNOSIS — F419 Anxiety disorder, unspecified: Secondary | ICD-10-CM | POA: Diagnosis not present

## 2021-07-16 DIAGNOSIS — F909 Attention-deficit hyperactivity disorder, unspecified type: Secondary | ICD-10-CM

## 2021-07-16 NOTE — Progress Notes (Signed)
[facility] Counselor Initial Adult Exam  Name: Luis Villa Date: 07/16/2021 MRN: [id number] DOB: 09/07/1985 PCP: Burchette, Bruce W, MD  Time spent: 3-3:45 pm  Guardian/Informant:  Luis Villa - self    Paperwork requested: No   Met with patient for initial interview.  Patient was at home and session was conducted from therapist's office via video conferencing.  Patient verbally consented to telehealth.    Reason for Visit /Presenting Problem: patient reports difficulty with focusing, feeling like he is in a fog.  Can start several projects without finishing them without assistance. Gets overwhelmed when feels like he cannot finish a task.  Has always had these difficulties but believes that it has been getting worse as he has gotten older.    Mental Status Exam: Appearance:   Neat and Well Groomed     Behavior:  Appropriate and Sharing  Motor:  Normal  Speech/Language:   Clear and Coherent and Normal Rate  Affect:  Appropriate  Mood:  euthymic  Thought process:  normal  Thought content:    WNL  Sensory/Perceptual disturbances:    WNL  Orientation:  oriented to person, place, time/date, and situation  Attention:  Good  Concentration:  Fair  Memory:  Remote;   Fair  Fund of knowledge:   Good  Insight:    Fair  Judgment:   Good  Impulse Control:  Fair   Reported Symptoms:  Sleeps poor between young children and job as a firefighter (on call).  No changes in appetite.  Energy is variable during the day.  Some lethargy, some high energy.  Tried Adderall which works 'some-what' per pt.  No sadness or depression. Feels overwhelmed at times.  No specific triggers but shuts down when starts to think or list all the activities he has to do. Tries to do all of them at once without completing any.  Some anxiety but no panic.  No specific worries/fears.  Anxiety is situational specific.  Some obsessive thought - work, kids.  No compulsive behavior.  Trouble paying  attention, easily distracted, frequent losing and forgetting.  Organization is inconsistent.  Occasional restlessness/fidgeting.  No verbal impulsivity some impulsive behavior.        Risk Assessment: Danger to Self:  No Self-injurious Behavior: No Danger to Others: No Duty to Warn:no Physical Aggression / Violence:No  Access to Firearms a concern: No  Gang Involvement:No  Patient / guardian was educated about steps to take if suicide or homicide risk level increases between visits: n/a While future psychiatric events cannot be accurately predicted, the patient does not currently require acute inpatient psychiatric care and does not currently meet [place] involuntary commitment criteria.  Substance Abuse History: Current substance abuse: No   occasional social drinking only.    Past Psychiatric History:   No previous psychological problems have been observed Outpatient Providers:None History of Psych Hospitalization: No  Psychological Testing:  None    Developmental history  Birth complications - None Delays - Delayed with reading - otherwise typical Gross motor - Good Fine motor - Good Speech - Good Self help - Good Social - good  Abuse History:  Victim of: No.,  none    Report needed: No. Victim of Neglect:No. Perpetrator of  None   Witness / Exposure to Domestic Violence: No   Protective Services Involvement: No  Witness to Community Violence:  No  but works as firefighter and has witnessing traumatic events.  PTSD symptoms denied.  Family History:  Family History    Problem Relation Age of Onset   Crohn's disease Father    Heart disease Maternal Grandfather    Hypertension Other   No MH problems  Living situation: the patient lives with their spouse and children  Sexual Orientation: Straight  Relationship Status: married - 6 years.  Good relations Name of spouse / other:Luis Villa If a parent, number of children / ages:4 and 3 years.    Good relations with  children no specific concerns.  Youngest child previously had medical concerns but no longer has them.      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  Occasional contracting, although kids keeping him too busy to do these.  Does well on the job but struggles with organization and management skills due to lack of focus.       Military Service: No   Educational History: Education: some Production assistant, radio in [employer].  Struggled with school but was able to push through.  Took longer than expected to complete school. Struggled with reading but no other academic problems. Had assistance for test taking during middle and high school due to slow reading.      Any cultural differences that may affect / interfere with treatment:  not applicable   Recreation/Hobbies: fishing  Stressors: Marital or family conflict   Occupational concerns   Getting things done at work and home, managing/parenting children.  Strengths: Can perform well when focused.  Good leadership.  Barriers:  Lack of motivation/focus. Feels like stands in own way of progress.    Legal History: Pending legal issue / charges: The patient has no significant history of legal issues.  Medical History/Surgical History: reviewed Past Medical History:  Diagnosis Date   PERS HX TOBACCO USE PRESENTING HAZARDS HEALTH 08/06/2009   No digestion problems.  Likely had some concussions playing sports.  None formally diagnosed.    Past Surgical History:  Procedure Laterality Date   TONSILLECTOMY      Medications: Current Outpatient Medications  Medication Sig Dispense Refill   amphetamine-dextroamphetamine (ADDERALL XR) 20 MG 24 hr capsule Take 1 capsule (20 mg total) by mouth every morning. 30 capsule 0   amphetamine-dextroamphetamine (ADDERALL XR) 30 MG 24 hr capsule  Take 1 capsule (30 mg total) by mouth every morning. 30 capsule 0   No current facility-administered medications for this visit.    Allergies  Allergen Reactions   Cefaclor     Ceclor= hives    Diagnoses:  Attention deficit hyperactivity disorder (ADHD), unspecified ADHD type  Anxiety disorder, unspecified type  Plan of Care: patient presents with a history of attention and learning deficits which were reported to have become more intense and more interfering over the past several years.  Patient starts multiple projects without completing them which leaves hi feeling overwhelmed, resulting in emotionally shutting down.  Difficulty with attention, distractibility, forgetfulness, and poor organization was reported along with some physical restlessness and impulsivity.  Some anxiety was also noted.  Testing recommended to evaluate for ADHD along with other conditions that may be affecting attention.     Rainey Pines, PhD

## 2021-07-16 NOTE — Progress Notes (Signed)
                Shloime Keilman, PhD 

## 2021-08-08 ENCOUNTER — Telehealth: Payer: Self-pay | Admitting: Family Medicine

## 2021-08-08 MED ORDER — AMPHETAMINE-DEXTROAMPHET ER 30 MG PO CP24: 30.0000 mg | ORAL_CAPSULE | ORAL | 0 refills | Status: DC

## 2021-08-08 NOTE — Telephone Encounter (Signed)
Prescription sent

## 2021-08-08 NOTE — Telephone Encounter (Signed)
Pt states after 1 month trial things are going well and he would like to continue with the medication and would like additional refills and is requesting a call to advise him of refill status amphetamine-dextroamphetamine (ADDERALL XR) 30 MG 24 hr capsule  [facility] [address] [facility] - [place], [place] - [address] Phone:  [phone]  Fax:  [phone]

## 2021-08-08 NOTE — Telephone Encounter (Signed)
Message sent to PCP as the patient requests the Rx be sent to [facility] instead.

## 2021-08-08 NOTE — Telephone Encounter (Signed)
Last Rx given on 6/3 for #30

## 2021-08-10 MED ORDER — AMPHETAMINE-DEXTROAMPHET ER 30 MG PO CP24: 30.0000 mg | ORAL_CAPSULE | ORAL | 0 refills | Status: DC

## 2021-08-10 NOTE — Addendum Note (Signed)
Addended by: Kristian Covey on: 08/10/2021 01:12 PM   Modules accepted: Orders

## 2021-08-20 ENCOUNTER — Encounter: Payer: Self-pay | Admitting: Psychology

## 2021-08-20 ENCOUNTER — Ambulatory Visit (INDEPENDENT_AMBULATORY_CARE_PROVIDER_SITE_OTHER): Payer: 59 | Admitting: Psychology

## 2021-08-20 DIAGNOSIS — F909 Attention-deficit hyperactivity disorder, unspecified type: Secondary | ICD-10-CM

## 2021-08-20 DIAGNOSIS — F419 Anxiety disorder, unspecified: Secondary | ICD-10-CM

## 2021-08-20 NOTE — Progress Notes (Signed)
                Karlos Scadden, PhD 

## 2021-08-20 NOTE — Progress Notes (Signed)
[facility] Testing Progress Note  Patient ID: Luis Villa, MRN: [id number],    Date: 08/20/2021  Time Spent: 8-10 am   Treatment Type: Testing  Met with patient for testing session.  Patient was at the clinic and session was conducted from therapist's in person.    Reported Symptoms: Reason for Visit /Presenting Problem: Patient presents with a history of attention and learning deficits which were reported to have become more intense and more interfering over the past several years.  Patient starts multiple projects without completing them which leaves hi feeling overwhelmed, resulting in emotionally shutting down.  Difficulty with attention, distractibility, forgetfulness, and poor organization was reported along with some physical restlessness and impulsivity.  Some anxiety was also noted.  Testing recommended to evaluate for ADHD along with other conditions that may be affecting attention.     Mental Status Exam: Appearance:  Neat and Well Groomed     Behavior: Appropriate  Motor: Normal  Speech/Language:  Clear and Coherent and Normal Rate  Affect: Appropriate  Mood: normal  Thought process: Typical  Thought content:   WNL  Sensory/Perceptual disturbances:   WNL  Orientation: oriented to person, place, time/date, and situation  Attention: Good  Concentration: Fair  Memory: Working memory - fair  Fund of knowledge:  Good  Insight:   Good  Judgment:  Good  Impulse Control: Good   Risk Assessment: Danger to Self:  No Self-injurious Behavior: No Danger to Others: No  Behavior Observations: Patient was cooperative and displayed good effort. Attention and concentration were inconsistent overall, as patient exhibited one instance of self-correction and needed several questions to be repeated.  Mood was euthymic with mildly restricted affect.  The results appear representative of current functioning.    Subjective: Testing included the K-BIT-2R (0.75 hrs. for  testing and scoring) along with the CNS Vital signs (0.75 hrs.) and BRIEF-A (0.5 hrs.).      Diagnosis:Anxiety disorder, unspecified type  Attention deficit hyperactivity disorder (ADHD), unspecified ADHD type  Plan: Testing complete. Report writing to be conducted followed by interactive feedback next session.   Rainey Pines, PhD

## 2021-08-21 ENCOUNTER — Encounter: Payer: Self-pay | Admitting: Psychology

## 2021-08-21 NOTE — Progress Notes (Signed)
Luis Villa is a 36 y.o. male patient Report writing competed ( 2 hrs.).  Interactive feedback to be conducted next session. Report to be attached to the feedback progress note. Marland Kitchen   Bryson Dames, PhD

## 2021-09-16 ENCOUNTER — Other Ambulatory Visit: Payer: Self-pay | Admitting: Family Medicine

## 2021-09-19 MED ORDER — AMPHETAMINE-DEXTROAMPHET ER 30 MG PO CP24: 30.0000 mg | ORAL_CAPSULE | ORAL | 0 refills | Status: DC

## 2021-09-23 ENCOUNTER — Encounter: Payer: Self-pay | Admitting: Psychology

## 2021-09-23 ENCOUNTER — Ambulatory Visit (INDEPENDENT_AMBULATORY_CARE_PROVIDER_SITE_OTHER): Payer: 59 | Admitting: Psychology

## 2021-09-23 DIAGNOSIS — F4323 Adjustment disorder with mixed anxiety and depressed mood: Secondary | ICD-10-CM | POA: Diagnosis not present

## 2021-09-23 DIAGNOSIS — F900 Attention-deficit hyperactivity disorder, predominantly inattentive type: Secondary | ICD-10-CM | POA: Diagnosis not present

## 2021-09-23 NOTE — Progress Notes (Signed)
[facility] Counselor/Therapist Progress Note  Patient ID: Abubakar Crispo, MRN: [id number],    Date: 09/23/2021  Time Spent: 8-8:45 am   Treatment Type:  Testing - Feedback Session  Met with patient to review results of testing.  Patient was at home and session was conducted from therapist's office via video conferencing.  Patient verbally consented to telehealth.       Reported Symptoms: Patient presents with a history of attention and learning deficits which were reported to have become more intense and more interfering over the past several years.  Patient starts multiple projects without completing them which leaves hi feeling overwhelmed, resulting in emotionally shutting down.  Difficulty with attention, distractibility, forgetfulness, and poor organization was reported along with some physical restlessness and impulsivity.  Some anxiety was also noted.  Testing recommended to evaluate for ADHD along with other conditions that may be affecting attention.    Subjective: Interactive feedback was conducted (1 hr.).  It was discussed how patient met the criterion for ADHD along with how this condition affects his ability to focus and complete tasks, as well as how it relates to his mood.  Recommendations included discussing results with PCP, developing a visual organization system, and seeking individual counseling or organizational coaching to help manage his activities and stress if needed.  Patient expressed agreement with the results and recommendations.     Total Time of Testing: 5 hrs. Testing and Scoring: 2 hrs. Interactive Feedback:1 hr. Report Writing: 2 hrs.     Diagnosis:Attention deficit hyperactivity disorder (ADHD), predominantly inattentive type  Adjustment disorder with mixed anxiety and depressed mood  Plan: Report to be sent to parent and referring provider.     Rainey Pines, PhD

## 2021-09-23 NOTE — Progress Notes (Signed)
                Huzaifa Viney, PhD 

## 2021-09-23 NOTE — Progress Notes (Signed)
Psychological Testing Report - Confidential  Identifying Information:               Patient's Name:   Luis Villa  Date of Birth:   05/10/1985     Age:                36 years  MRN#:                                   [id number]      Date of Assessment:              August 20, 2021         Purpose of Evaluation:  The purpose of the evaluation is to provide diagnostic information and treatment recommendations.     Referral Information: Luis Villa was a 36year-old Caucasian male referred by Luis Peat, MD for testing regarding Attention Deficit Hyperactivity Disorder (ADHD).  Luis Villa reported difficulty with focusing, feeling like he is in a fog.  He can start several projects without finishing them, needing assistance to do so.  He gets overwhelmed when feels like he cannot finish a task.  He has always had these difficulties but believes that his attention has been getting worse as he has aged.    Relevant Background Information:  Developmental history was reported to be generally typical. Birth complications were denied.  Luis Villa reported being delayed with reading, having otherwise typical early development.  Gross motor, fine motor, and speech development were reported to be well developed.  Self-help, independent, and socialization skills were reported to be typical.  Medical history was reported to be significant for previous tobacco use and an allergy to Cefaclor.  Luis Villa denied digestion problems.  He stated that he likely had some concussions while playing sports, but none were formally diagnosed.  Past surgical history is significant for a tonsillectomy.  Current medications include amphetamine-dextroamphetamine (ADDERALL XR) 20 MG 24 hr. capsule, prescribed on a trial basis by Dr. Caryl Villa pending the results of this evaluation.  Current substance use includes occasional social drinking.  No previous psychological problems were reported.  Luis Villa has  not seen any outpatient psychotherapy providers and he has not received psychiatric hospitalization or previous psychological testing.    Educationally, Luis Villa reported that he completed his Associates degree in [employer].  He struggled academically throughout his school tenure but was able to pass his classes with much effort.  It took him longer than expected to complete work assignments.  He indicated having much difficulty with reading but denied other specific learning deficits.  He received accommodations for test taking during middle and high school due to slow reading.  Strengths performing well when he can focus, along with Villa leadership skills.  He is currently working for the [employer] as Engineer, agricultural.  He has been working for this organization for the past 13 years.  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  Luis Villa reported that he performs well on the job but struggles with organization and time management skills, along with his lack of focus.  Leisure activities include fishing.  Luis Villa is currently living with his wife Aundra Millet and two children ages 40 and 3 years.  He has been married for 6 years and reported having Villa marital relations. Villa relations were reported with  his children overall, although difficulty was indicated regarding time management of household tasks and parenting responsibilities.  Luis Villa youngest child previously had medical concerns, but they have since resolved.  He reported receiving much support from his wife and parents.   Family mental health history was reported to be unremarkable.  A history of abuse or trauma was denied.  Current stressors include keeping up with home and work responsibilities.  Luis Villa reported a lack of motivation/focus, feeling like he is preventing himself from being more successful.         Presenting Symptomology:  Luis Villa reported that he sleeps poorly due to having young children and his work as a IT sales professional (being on call).  Recent changes in appetite were denied.  Energy was reported to be variable during the day.  The Adderall was reported to be mildly helpful in increasing energy.  Episodes of prolonged sadness or depression were denied, although Luis Villa indicated feeling overwhelmed at times.  He denied specific triggers but indicated that he "shuts down" emotionally when he starts thinking about or listing all the activities he must do.  He tries to do all of them at once without completing any.  Some anxiety was reported without panic.  Specific worries/fears were denied.  Anxiety was reported to be situational specific.  Some obsessive thought was reported regarding his work and children.  Compulsive behavior was denied.  Luis Villa reported having trouble paying attention, being easily distracted, frequently losing items, and often forgetting important information.  Organization was reported to be inconsistent.  Occasional restlessness/fidgeting was reported along with some impulsive behavior.           Procedures Administered: Carlos American Brief Intelligence Test - 2 7  9  Client: Luis Villa       DOB:  [birth year]         MRN# 02/01/1985 [facility] [address] [place], [place], [place] CNS Vital Signs 7  9 Client: Luis Villa       DOB:  06-13-1985         MRN# 02/01/1985 [facility] [address] [place], [place], [place] Behavior Rating Inventory of Executive Function - Adult Self Report7  9 Client: Luis Villa       DOB:  [birth year]         MRN# 02/01/1985 [facility] Medicine [address] [place], [place], [place]  Adult ADHD Self Report Scale7  9 Client: Luis Villa       DOB:  1985-09-04         MRN# 02/01/1985 [facility] Medicine [address] [place], [place],  [place]  Depression Anxiety and Stress Scale 7  9 Client: Luis Villa       DOB:  [birth year]         MRN# 02/01/1985 [facility] Behavioral Medicine [address] [place], [place], [place] Adult OCD 7  9 Client: Martavious Hartel       DOB:  1985/04/13         MRN# 02/01/1985 [facility] Medicine [address] [place], [place], [place] Inventory Procedural Considerations: Mr. Holleman was seen in person at the clinic.  He was not medicated for the evaluation.  Behavioral Observations:  Mr. Tomko was cooperative and displayed Villa effort. Attention and concentration were inconsistent overall, as Mr. Glendinning exhibited one instance of self-correction and needed several questions to be repeated.  Mood was euthymic with mildly restricted affect.  The results appear representative of current  functioning.  Mr. Laufer was neatly dressed and well groomed.  Brief mental status indicated typical general orientation and alertness.  Recent, remote, and immediate memory were intact, with some typical delayed memory.  Working memory was fair.  Judgement and insight were Villa.  Hallucinations, delusions, and dangerous ideation were denied.       Test Results and Interpretation:   General Intellectual Functioning:  The K-BIT 2 was used to assess Luis Villa performance across two areas of cognitive ability. When interpreting these scores, it is important to view the results as a snapshot of current intellectual functioning. As measured by the K-BIT 2, Luis Villa Composite IQ score fell within the low average range when compared to same age peers (CIQ = 61).  His performance was relatively consistent across the Primary Index Scores, as Verbal Comprehension (VCI = 94) was only slightly better developed than Perceptual Reasoning (PRI = 88).  The difference was not statistically significant.  This indicates age typical and slightly better developed language understanding than visual learning  ability.  On individual subtests, Luis Villa performed within the age typical range for verbal knowledge and inferential thinking (riddles) with mild difficulty visual pattern analysis (matrices).  Overall, Mr. Goodbar appears to have adequately developed verbal comprehension with low typical nonverbal reasoning.    Carlos American Brief Intelligence Test - 2 Composite Score Summary  Composite Scores  Sum of Raw Scores Composite Score Percentile Rank 90% Confidence Interval Qualitative Description  Verbal Comprehension  VC          87     94  34  89-100  Average  Nonverbal Reasoning PR 30    88 21 83-946 Low Average  Composite IQ  FSIQ -    89 23 85-93 Low Average    K-BIT 2 Continued Domain Subtest Name  Total Raw Score Scaled Score Percentile Rank  Verbal Verbal Knowledge VK 50   9 37  Comprehension Riddles Ri 37   9 37    Attention and Processing:                                                       CNS Vital Signs   Domain Scores Standard Score %ile Validity Indicator Guideline  Neurocognitive Index 67 1 No Very Low  Composite Memory 73 4 Yes Low  Verbal Memory 80 9 Yes Below Average  Visual Memory 76 5 Yes Low  Psychomotor speed 80 9 Yes Below Average  Reaction Time 40 1 Yes Very Low  Complex Attention 69 2 No Very Low  Cognitive Flexibility 73 4 No Low  Processing Speed 87 19 Yes Low Average  Executive Function 75 5 Yes Low  Working Memory 81 10 No Below Average  Sustained Attention 72 3 No Low  Simple Attention  13 1 Yes Very Low  Motor Speed 84 14 Yes Below Average   The results of the CNS Vital Signs testing indicated very low overall neurocognitive processing ability, at a level well below measured intellectual ability (low average).  Regarding areas related to attention problems, simple and complex attention were very low, while cognitive flexibility, executive function, and sustained attention were low.  These are the domains most closely associated with  attention deficits.  Motor/psychomotor speed was below average, while reaction time was very low with low average processing speed,  indicating mildly slow thinking speed, with very slow responsiveness and slow hand/eye speed on computerized measures.  Visual memory was low with below average working memory and verbal memory, indicating difficulty with all memory aspects and multitasking.  The results suggest that Mr. Voorhees appears to have well below age typical ability attending to simple and complex tasks, with impaired responsiveness and memory.  The results, however, should be interpreted with caution as some of the measures were deemed valid.    Executive Function:  Mr. Wanless completed the Self-Report Form of the Behavior Rating Inventory of Executive Function-Adult Version (BRIEF-A) on 08/20/2021. There are no missing item responses in the protocol. Ratings of Mr. Gulino self-regulation do not appear overly negative.  Items were completed in a reasonable fashion, suggesting that the respondent did not respond to items in a haphazard or extreme manner. Responses are reasonably consistent. In the context of these validity considerations, ratings of Mr. Beckford everyday executive function suggest some areas of concern. The overall index, the Global Executive Composite (GEC), was elevated (GEC T = 68, %ile = 96). Both the Behavioral Regulation (BRI) and the Metacognition (MI) Indexes were elevated (BRI T = 68, %ile = 94 and MI T = 65, %ile = 94).    Within these summary indicators, all the individual scales are valid. One or more of the individual BRIEF-A scales were elevated, suggesting that Mr. Cassin reports difficulty with some aspects of executive function. Concerns are noted with his ability to inhibit impulsive responses, adjust to changes in routine or task demands, initiate problem solving or activity, sustain working memory, and plan and organize problem-solving approaches.  Additionally, Mr. Germano elevated scores on the Inhibit scale, and the Behavioral Regulation and the Metacognition Indexes, suggest that Mr. Benzel is perceived as having poor inhibitory control and suggests that more global behavioral dysregulation is having a negative effect on active problem solving. Mr. Presley ability to modulate emotions, monitor social behavior, attend to task-oriented output, and organize environment and materials is not described as problematic.        BRIEF-A Score Summary Table Scale/Index Raw score T score Percentile  Inhibit 16 65 96  Shift 13 69 98  Emotional Control 21 64 91  Self-Monitor 11 59 85  Behavioral Regulation Index (BRI) 61 68 94  Initiate 17 68 96  Working Memory 16 67 96  Plan/Organize 20 66 93  Task Monitor 10 56 76  Organization of Materials 15 57 81  Metacognition Index (MI) 78 65 94  Global Executive Composite (GEC) 139 68 96        Behavioral - Emotional Functioning: Ratings of behavior and emotional functioning indicated much attention and organization difficulty.  On the Adult ADHD Self Report Scale, Mr. Regis positively endorsed, as occurring often or very often, 8 of 9 items for intention/poor organization and 3 of 9 items for hyperactivity and poor impulse control.  Additionally, all 6 critical items were highly endorsed including difficulty finishing tasks, organizing, remembering appointments, and starting tasks along with frequent fidgeting and feeling compelled to move.  Endorsement of at least 5 items in either category along with 4 critical items is considered at-risk for ADHD.    Ratings for general emotional functioning indicated moderate emotional distress.  On the Depression, Anxiety, and Stress Scale, Mr. Furney's responses indicated mild to moderate levels of depressed mood, with moderate levels of stress.  Anxiety was rated as not a concern.  Six of the 21 items were highly endorsed on this  measure including  trouble winding down, lack of positive feeling, lack of initiative, agitation, trouble relaxing, and lack of enthusiasm.  Conversely, Mr. Stuard total score on the Adult OCD Inventory was in the 'not a problem' range. No items were highly endorsed on this measure.    Summary:   Mr. Swopes was evaluated during July 2023 related to reported attention and organization deficits.  Mr. Blatt presents with a history of attention and learning deficits which were reported to have become more intense and more interfering over the past several years.  He starts multiple projects without completing them which leaves him feeling overwhelmed, resulting in emotionally shutting down.  Difficulty with attention, distractibility, forgetfulness, and poor organization was reported along with some physical restlessness and impulsivity.  Some anxiety was also noted.  Testing was recommended to evaluate for ADHD along with other conditions that may be affecting attention.   Test results indicated low average overall intelligence (K-BIT 2R), with slightly better developed Verbal Comprehension than Nonverbal Reasoning.  Processing Neurocognitive skills testing indicated severely impaired attention for simple and complex tasks with poor responsiveness and memory.  Processing speed was mildly low with below average motor speed.  Slow motor speed is often associated with depressed mood while slow processing speed can be related to distractibility or anxiety/perfection.  Ratings for executive function indicated a moderately high level of difficulty in this area including clinically significant problems with impulse control, shifting attention, working memory, planning/organization, and initiation.  Ratings for behavior functioning suggested many ADHD-Inattentive related symptoms, with mild to moderate levels of depressed mood and moderate stress.  Mr. Rames stated that most of his worries are related to managing work, home, and  parenting related tasks, while depressed mood seems most related to his difficulty in completing these tasks.  Mr. Holberg meets the criterion for ADHD, based on developmental history and current behavior, with anxiety and depressed mood related to time management issues and frustration related to not completing tasks.  Recommendations include discussing results with Primary Care physician while seeking individual counseling and developing a visual organization system.  See below for further recommendations.       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  Medication for attention deficits is recommended to continue.  Medication for anxiety and mood regulation is does not seem necessary currently, although an antidepressant could be helpful if anxiety or depressed mood become more severe or interfering. Individual counseling is recommended to help Mr. Signer with managing his stress and depressed mood, developing positive thought processes, and improving organizational skills.  Due to Mr. Fentress executive function deficits, he would benefit from a structured therapy approach that focuses on the teaching of physical calming, cognitive restructuring, mindfulness, and organizational skills. Mental alertness/energy can also be raised by increasing exercise, improving sleep, eating a healthy diet, and managing depression/stress.  Consult with a physician regarding any changes to physical regimen.  Mr. Boyd can have increased success with completing tasks by breaking up activities into small manageable chunks and spreading out work over longer periods of time with frequent breaks.  Developing visual supports and a system for generating  and accessing reminder will help with keeping Mr. Goates on task with less prompting by others.  Listening skills can be enhanced by asking the speaker to give  information in small chunks and asking for explanation and clarification when needed.  Compensatory strategies for executive function deficits can take several forms including using external aides (e.g., use of a notebook), learning cognitive strategies (e.g., verbalization), and making environmental modifications (e.g., keeping workspace clutter-free). Research has demonstrated that both healthy adults as well as individuals who have executive deficits commonly rely on external aids for executive and other cognitive processes. More frequent use of aides or strategies and the use of a greater variety of aides is helpful when it comes to memory, and this also may hold true for executive dysfunction. Consult the [employer] website or the You Tube Channel for Ronalee Beltsussel Barkley, Ph.D. for more information.  It was a pleasure working with you.  Please feel free to contact me with any questions or concerns.       Salvatore DecentSteven C. Dewarren Ledbetter, Ph.D. Licensed Psychologist - [facility] [place] Licensed psychologist [phone]                Bryson DamesSTEVEN Omarie Parcell, PhD

## 2021-10-10 ENCOUNTER — Encounter: Payer: Self-pay | Admitting: Family Medicine

## 2021-10-10 ENCOUNTER — Ambulatory Visit (INDEPENDENT_AMBULATORY_CARE_PROVIDER_SITE_OTHER): Payer: 59 | Admitting: Family Medicine

## 2021-10-10 VITALS — BP 120/70 | HR 50 | Temp 98.0°F | Ht 70.0 in | Wt 211.6 lb

## 2021-10-10 DIAGNOSIS — R4589 Other symptoms and signs involving emotional state: Secondary | ICD-10-CM | POA: Diagnosis not present

## 2021-10-10 MED ORDER — SERTRALINE HCL 50 MG PO TABS: 50.0000 mg | ORAL_TABLET | Freq: Every day | ORAL | 3 refills | Status: DC

## 2021-10-10 NOTE — Patient Instructions (Signed)
Give me some feedback in 3-4 weeks regarding the Zoloft.

## 2021-10-10 NOTE — Progress Notes (Signed)
   Established Patient Office Visit  Subjective   Patient ID: Luis Villa, male    DOB: 08/25/1985  Age: 36 y.o. MRN: [id number]  Chief Complaint  Patient presents with   Medication Consultation   Follow-up    HPI   Luis Villa had recent assessment with one of our clinical psychologist.  This was for an ADD assessment.  This confirmed ADD patient also had evidence for some anxiety and depression issues.  He feels like these have been somewhat progressive.  No suicidal ideation.  Never treated for depression previously.  He feels like he has some daily fairly pervasive anxiety symptoms.  He has had counseling in the past and did not feel like that helped much.  Who is specifically interested in medication options  He feels like in general things are going fairly well with regard to work and his family life.  Generally good motivation.  Has been attentive to exercise and good nutrition and overall health.  Past Medical History:  Diagnosis Date   PERS HX TOBACCO USE PRESENTING HAZARDS HEALTH 08/06/2009   Past Surgical History:  Procedure Laterality Date   TONSILLECTOMY      reports that he has quit smoking. His smoking use included cigarettes. He has a 10.00 pack-year smoking history. He has never used smokeless tobacco. He reports that he does not use drugs. No history on file for alcohol use. family history includes Crohn's disease in his father; Heart disease in his maternal grandfather; Hypertension in an other family member. Allergies  Allergen Reactions   Cefaclor     Ceclor= hives    Review of Systems  Constitutional:  Negative for malaise/fatigue and weight loss.  Neurological:  Negative for headaches.  Psychiatric/Behavioral:  Positive for depression. Negative for hallucinations, memory loss, substance abuse and suicidal ideas. The patient is nervous/anxious.       Objective:     BP 120/70 (BP Location: Left Arm, Patient Position: Sitting, Cuff Size: Normal)   Pulse (!)  50   Temp 98 F (36.7 C) (Oral)   Ht 5\' 10"  (1.778 m)   Wt 211 lb 9.6 oz (96 kg)   SpO2 100%   BMI 30.36 kg/m    Physical Exam Vitals reviewed.  Cardiovascular:     Rate and Rhythm: Normal rate and regular rhythm.  Neurological:     Mental Status: He is alert.      No results found for any visits on 10/10/21.    The ASCVD Risk score (Arnett DK, et al., 2019) failed to calculate for the following reasons:   The 2019 ASCVD risk score is only valid for ages 21 to 43    Assessment & Plan:   #1 adult ADD.  Stable on Adderall XR 30 mg once daily.  Continue current dosage of medication.  He is tolerating well with no side effects  #2 somewhat progressive chronic depression symptoms with overlapping anxiety symptoms confirmed on recent psychological testing.  Patient feels like counseling has not benefited him in the past.  We discussed trial of low-dose sertraline 50 mg once daily.  Reviewed potential side effects.  Give feedback in 3 to 4 weeks.  No follow-ups on file.    76, MD

## 2021-10-19 ENCOUNTER — Other Ambulatory Visit: Payer: Self-pay | Admitting: Family Medicine

## 2021-11-03 ENCOUNTER — Encounter: Payer: Self-pay | Admitting: Family Medicine

## 2021-11-04 MED ORDER — SERTRALINE HCL 100 MG PO TABS: 100.0000 mg | ORAL_TABLET | Freq: Every day | ORAL | 0 refills | Status: DC

## 2021-11-21 ENCOUNTER — Other Ambulatory Visit: Payer: Self-pay | Admitting: Family Medicine

## 2021-11-21 ENCOUNTER — Telehealth: Payer: Self-pay | Admitting: Family Medicine

## 2021-11-21 MED ORDER — AMPHETAMINE-DEXTROAMPHET ER 30 MG PO CP24: 30.0000 mg | ORAL_CAPSULE | ORAL | 0 refills | Status: DC

## 2021-11-21 NOTE — Telephone Encounter (Signed)
Pt checking on prescription for amphetamine-dextroamphetamine (ADDERALL XR) 30 MG 24 hr capsule. says it was denied but this needs to be filled month to month.

## 2021-11-24 NOTE — Telephone Encounter (Signed)
Left a message for the pt to return my call.  

## 2021-11-24 NOTE — Telephone Encounter (Signed)
Patient returned call

## 2021-11-24 NOTE — Telephone Encounter (Signed)
I spoke with the patient and informed him that [facility] [place]. [facility] stated [facility] would [facility] to [facility] but would like [facility] in [place] to be used for future refills.

## 2021-11-28 ENCOUNTER — Encounter: Payer: Self-pay | Admitting: Family Medicine

## 2021-12-01 MED ORDER — SERTRALINE HCL 100 MG PO TABS: 100.0000 mg | ORAL_TABLET | Freq: Every day | ORAL | 3 refills | Status: DC

## 2022-01-21 ENCOUNTER — Other Ambulatory Visit: Payer: Self-pay | Admitting: Family Medicine

## 2022-01-21 MED ORDER — AMPHETAMINE-DEXTROAMPHET ER 30 MG PO CP24: 30.0000 mg | ORAL_CAPSULE | ORAL | 0 refills | Status: DC

## 2022-01-21 NOTE — Telephone Encounter (Signed)
Last OV 10/10/21.

## 2022-01-22 ENCOUNTER — Encounter: Payer: Self-pay | Admitting: Family Medicine

## 2022-02-04 ENCOUNTER — Encounter: Payer: Self-pay | Admitting: Family Medicine

## 2022-02-04 ENCOUNTER — Ambulatory Visit: Payer: 59 | Admitting: Family Medicine

## 2022-02-04 VITALS — BP 120/82 | HR 62 | Temp 98.0°F | Ht 70.0 in | Wt 219.6 lb

## 2022-02-04 DIAGNOSIS — F32A Depression, unspecified: Secondary | ICD-10-CM | POA: Diagnosis not present

## 2022-02-04 MED ORDER — ARIPIPRAZOLE 5 MG PO TABS: 5.0000 mg | ORAL_TABLET | Freq: Every day | ORAL | 3 refills | Status: DC

## 2022-02-04 NOTE — Progress Notes (Signed)
Established Patient Office Visit  Subjective   Patient ID: Luis Villa, male    DOB: 1985-02-09  Age: 37 y.o. MRN: [id number]  Chief Complaint  Patient presents with   Medication Consultation    HPI   Luis Villa is seen to discuss medications.  He has history of some depression and anxiety started sertraline several months ago.  He feels that with sertraline 100 mg daily his depression is definitely some improved but he still feels agitated at times.  He states that his mood seems to be "up and down ".  No history of insomnia or any periods of high energy or impulsivity.  No clinical suspicion for bipolar.  Denies any specific stressors.  He is married and has 2 children.  Works as a Airline pilot.  He has difficulty sleeping frequently at night.  Even though he feels less agitated with sertraline he still has periods of feeling somewhat agitated.  He specifically has questions about augmenting with mood stabilizer.  No known family history of bipolar.  Denies any suicidal ideation.  Minimal effect on libido with sertraline.  He recently has scaled back alcohol and start exercising more and working on improved diet.  Past Medical History:  Diagnosis Date   PERS HX TOBACCO USE PRESENTING HAZARDS HEALTH 08/06/2009   Past Surgical History:  Procedure Laterality Date   TONSILLECTOMY      reports that he has quit smoking. His smoking use included cigarettes. He has a 10.00 pack-year smoking history. He has never used smokeless tobacco. He reports that he does not use drugs. No history on file for alcohol use. family history includes Crohn's disease in his father; Heart disease in his maternal grandfather; Hypertension in an other family member. Allergies  Allergen Reactions   Cefaclor     Ceclor= hives    Review of Systems  Constitutional:  Negative for malaise/fatigue and weight loss.  Eyes:  Negative for blurred vision.  Respiratory:  Negative for shortness of breath.    Cardiovascular:  Negative for chest pain.  Neurological:  Negative for dizziness, weakness and headaches.  Psychiatric/Behavioral:  Positive for depression. Negative for suicidal ideas. The patient has insomnia.       Objective:     BP 120/82 (BP Location: Left Arm, Patient Position: Sitting, Cuff Size: Large)   Pulse 62   Temp 98 F (36.7 C) (Oral)   Ht 5\' 10"  (1.778 m)   Wt 219 lb 9.6 oz (99.6 kg)   SpO2 100%   BMI 31.51 kg/m    Physical Exam Vitals reviewed.  Constitutional:      Appearance: Normal appearance.  Cardiovascular:     Rate and Rhythm: Normal rate and regular rhythm.  Pulmonary:     Effort: Pulmonary effort is normal.     Breath sounds: Normal breath sounds.  Neurological:     Mental Status: He is alert.  Psychiatric:        Mood and Affect: Mood normal.        Thought Content: Thought content normal.      No results found for any visits on 02/04/22.    The ASCVD Risk score (Arnett DK, et al., 2019) failed to calculate for the following reasons:   The 2019 ASCVD risk score is only valid for ages 70 to 41    Assessment & Plan:   Depression and anxiety symptoms.  Overall improved with sertraline.  He still has some increased irritability frequently and is inquiring about augmenting medication.  We  agreed to trial of low-dose Abilify 5 mg nightly.  Continue healthy lifestyle changes with exercise, increased sleep, reduction in alcohol.  Recommend giving feedback in 1 month.  Carolann Littler, MD

## 2022-02-04 NOTE — Patient Instructions (Signed)
Give me some feedback in one month regarding the Abilify.

## 2022-02-20 ENCOUNTER — Other Ambulatory Visit: Payer: Self-pay | Admitting: Family Medicine

## 2022-02-20 ENCOUNTER — Encounter: Payer: Self-pay | Admitting: Family Medicine

## 2022-02-23 MED ORDER — AMPHETAMINE-DEXTROAMPHET ER 30 MG PO CP24: 30.0000 mg | ORAL_CAPSULE | ORAL | 0 refills | Status: DC

## 2022-03-23 ENCOUNTER — Other Ambulatory Visit: Payer: Self-pay | Admitting: Family Medicine

## 2022-03-24 MED ORDER — AMPHETAMINE-DEXTROAMPHET ER 30 MG PO CP24: 30.0000 mg | ORAL_CAPSULE | ORAL | 0 refills | Status: DC

## 2022-03-25 ENCOUNTER — Encounter: Payer: Self-pay | Admitting: Family Medicine

## 2022-03-26 MED ORDER — BUPROPION HCL ER (XL) 150 MG PO TB24: ORAL_TABLET | ORAL | 0 refills | Status: DC

## 2022-04-10 ENCOUNTER — Ambulatory Visit: Payer: Self-pay

## 2022-04-10 ENCOUNTER — Other Ambulatory Visit: Payer: Self-pay | Admitting: Nurse Practitioner

## 2022-04-10 DIAGNOSIS — Z0000 Encounter for general adult medical examination without abnormal findings: Secondary | ICD-10-CM

## 2022-04-20 ENCOUNTER — Other Ambulatory Visit: Payer: Self-pay | Admitting: Family Medicine

## 2022-04-21 MED ORDER — BUPROPION HCL ER (XL) 150 MG PO TB24: ORAL_TABLET | ORAL | 0 refills | Status: DC

## 2022-04-21 MED ORDER — AMPHETAMINE-DEXTROAMPHET ER 30 MG PO CP24: 30.0000 mg | ORAL_CAPSULE | ORAL | 0 refills | Status: DC

## 2022-04-23 ENCOUNTER — Other Ambulatory Visit: Payer: Self-pay | Admitting: Family Medicine

## 2022-05-28 ENCOUNTER — Other Ambulatory Visit: Payer: Self-pay | Admitting: Family Medicine

## 2022-05-29 MED ORDER — AMPHETAMINE-DEXTROAMPHET ER 30 MG PO CP24: 30.0000 mg | ORAL_CAPSULE | ORAL | 0 refills | Status: DC

## 2022-06-30 ENCOUNTER — Other Ambulatory Visit: Payer: Self-pay | Admitting: Family Medicine

## 2022-07-02 MED ORDER — AMPHETAMINE-DEXTROAMPHET ER 30 MG PO CP24: 30.0000 mg | ORAL_CAPSULE | ORAL | 0 refills | Status: DC

## 2022-08-07 ENCOUNTER — Other Ambulatory Visit: Payer: Self-pay | Admitting: Internal Medicine

## 2022-08-10 MED ORDER — AMPHETAMINE-DEXTROAMPHET ER 30 MG PO CP24: 30.0000 mg | ORAL_CAPSULE | ORAL | 0 refills | Status: DC

## 2022-09-07 ENCOUNTER — Other Ambulatory Visit: Payer: Self-pay | Admitting: Family Medicine

## 2022-09-08 ENCOUNTER — Other Ambulatory Visit: Payer: Self-pay | Admitting: Family Medicine

## 2022-10-12 ENCOUNTER — Other Ambulatory Visit: Payer: Self-pay | Admitting: Family Medicine

## 2022-10-14 ENCOUNTER — Other Ambulatory Visit: Payer: Self-pay | Admitting: Family Medicine

## 2022-10-16 ENCOUNTER — Encounter: Payer: Self-pay | Admitting: Family Medicine

## 2022-10-16 NOTE — Telephone Encounter (Signed)
Noted  

## 2022-10-16 NOTE — Telephone Encounter (Signed)
Noted once refill is due this will be sent to PCP for review

## 2022-11-14 ENCOUNTER — Other Ambulatory Visit: Payer: Self-pay | Admitting: Family Medicine

## 2022-11-16 MED ORDER — AMPHETAMINE-DEXTROAMPHET ER 30 MG PO CP24: 30.0000 mg | ORAL_CAPSULE | ORAL | 0 refills | Status: DC

## 2023-02-26 ENCOUNTER — Ambulatory Visit: Payer: 59 | Admitting: Psychology

## 2023-02-26 DIAGNOSIS — F4323 Adjustment disorder with mixed anxiety and depressed mood: Secondary | ICD-10-CM | POA: Diagnosis not present

## 2023-02-26 NOTE — Progress Notes (Signed)
 [facility] Counselor Initial Adult Exam  Name: Luis Villa Date: 02/26/2023 MRN: [id number] DOB: 10-Aug-1985 PCP: Kristian Covey, MD  Time Spent: 4:01  pm - 5:00 pm : 59 Minutes  Guardian/Payee:  self    Paperwork requested: No   Reason for Visit /Presenting Problem: Something happened at work, this was my push for coming here. 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  . Mental Status Exam: Appearance:   Well Groomed     Behavior:  Appropriate  Motor:  Normal  Speech/Language:   Normal Rate  Affect:  Appropriate  Mood:  normal  Thought process:  normal  Thought content:    WNL  Sensory/Perceptual disturbances:    WNL  Orientation:  oriented to person, place, and time/date  Attention:  Good  Concentration:  Good  Memory:  WNL  Fund of knowledge:   Good  Insight:    Good  Judgment:   Good  Impulse Control:  Good   Reported Symptoms:  Feeling nervous, not being able to control worrying, trouble relaxing, becoming easily annoyed and irritable, little interest in doing things, trouble sleeping, having little energy, trouble concentrating.  Risk Assessment: Danger to Self:  No Self-injurious Behavior: No Danger to Others: No Duty to Warn:no Physical Aggression / Violence:No  Access to Firearms a concern: No  Gang Involvement:No Patient / guardian was educated about steps to take if suicide or homicide risk level increases between visits: yes While future psychiatric events cannot be accurately predicted, the patient does not currently require acute inpatient psychiatric care and does not currently meet [facility] involuntary commitment criteria.  Substance Abuse History: Current substance abuse: Yes     Tobacco: Smoked heavily in the past. Alcohol: Yes Substance use: No  Past Psychiatric History:   No previous psychological problems have been observed Outpatient Providers:No History  of Psych Hospitalization: No  Psychological Testing:Yes  Abuse History:  Victim of: No.,  N/A    Report needed: No. Victim of Neglect:No. Perpetrator of Abuse: No Witness / Exposure to Domestic Violence: No   Protective Services Involvement: No  Witness to [employer] Violence:  No   Family History:  Family History  Problem Relation Age of Onset   Crohn's disease Father    Heart disease Maternal Grandfather    Hypertension Other     Living situation: the patient lives with their family  Sexual Orientation: Straight  Relationship Status: married  Name of spouse / other:Luis Villa If a parent, number of children / ages: 23 Luis Villa, son, 22 Luis Villa, daughter  Support Systems: spouse and parents  Surveyor, quantity Stress:  No   Income/Employment/Disability: Employment  Financial planner: No   Educational History: Education: some college, Scientist, research (physical sciences) in [employer]  Any cultural differences that may affect / interfere with treatment:  not applicable   Recreation/Hobbies: kayak fishing  Stressors: Occupational concerns    Strengths: Supportive Relationships  Barriers:  N/A   Legal History: Pending legal issue / charges: The patient has no significant history of legal issues.  Medical History/Surgical History: reviewed Past Medical History:  Diagnosis Date   PERS HX TOBACCO USE PRESENTING HAZARDS HEALTH 08/06/2009    Past Surgical History:  Procedure Laterality Date   TONSILLECTOMY      Medications: Current Outpatient Medications  Medication Sig Dispense Refill   amphetamine-dextroamphetamine (ADDERALL XR) 30 MG 24 hr capsule Take 1 capsule (30 mg total) by mouth every morning. 30 capsule 0  amphetamine-dextroamphetamine (ADDERALL XR) 30 MG 24 hr capsule Take 1 capsule (30 mg total) by mouth every morning. 30 capsule 0   amphetamine-dextroamphetamine (ADDERALL XR) 30 MG 24 hr capsule Take 1 capsule (30 mg total) by mouth every morning. 30 capsule 0   ARIPiprazole (ABILIFY)  5 MG tablet Take 1 tablet (5 mg total) by mouth daily. 30 tablet 3   buPROPion (WELLBUTRIN XL) 150 MG 24 hr tablet TAKE 1 TABLET BY MOUTH EVERY DAY 30 tablet 0   sertraline (ZOLOFT) 100 MG tablet Take 1 tablet (100 mg total) by mouth daily. 90 tablet 3   No current facility-administered medications for this visit.    Allergies  Allergen Reactions   Cefaclor     Ceclor= hives    Diagnoses:  Depression and anxiety  Psychiatric Treatment: No ,   Plan of Care: Outpatient psychotherapy  Narrative:  Luis Villa participated from office with therapist and consented to treatment. We reviewed the limits of confidentiality prior to the start of the evaluation. Luis Villa expressed understanding and agreement to proceed.   Patient is a 38 year old male who presented for an initial assessment. Patient reported the following symptoms: feeling nervous, not being able to control worrying, trouble relaxing, becoming easily annoyed and irritable, little interest in doing things, trouble sleeping, having little energy, trouble concentrating.Patient denied current and past suicidal ideation, homicidal ideation, and symptoms of psychosis. Patient reported lengthy history of heavy tobacco use, but no current tobacco use.  Patient currently drinks alcohol when he is not working, but denied any current or past drug use. 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 Additional stressors include trouble sleeping and trouble focusing. Patient admitted to hypervigilance, and realized that it's necessary for him to do his job well, but it can make being a parent difficult. Patient identified his spouse, Aundra Millet, and his parents, as current supports. Patient described his wife as very supportive and someone who has been with him through a number of challenges. Patient admits that he  is "not the best communicator" and he wants to become a better communicator for his wife and kids. Patient's future plans include running the [employer] in April-May, and that will allow him to have a regular schedule including being home with his family every night. Patient has a very healthy outlet in kayak fishing, and he tries to do that as often as he can. His top priorities continue to be his family and job responsibilities.   A follow-up was scheduled to create a treatment plan and begin treatment. Therapist answered  and all questions during the evaluation and contact information was provided.       Helyn Numbers

## 2023-03-01 ENCOUNTER — Ambulatory Visit: Payer: 59 | Admitting: Psychology

## 2023-03-01 DIAGNOSIS — F418 Other specified anxiety disorders: Secondary | ICD-10-CM

## 2023-03-01 DIAGNOSIS — F4323 Adjustment disorder with mixed anxiety and depressed mood: Secondary | ICD-10-CM

## 2023-03-01 NOTE — Progress Notes (Addendum)
  [facility] Counselor/Therapist Progress Note  Patient ID: Luis Villa, MRN: [id number]    Date: 03/01/2023  Time Spent: 9:00 am. - 9:55 am : 55 minutes  Treatment Type: Individual Therapy.  Reported Symptoms:  Feeling nervous, not being able to control worrying, trouble relaxing, becoming easily annoyed and irritable, little interest in doing things, trouble sleeping, having little energy, trouble concentrating.  Mental Status Exam: Appearance:  Well Groomed     Behavior: Appropriate  Motor: Normal  Speech/Language:  Normal Rate  Affect: Appropriate  Mood: normal  Thought process: normal  Thought content:   WNL  Sensory/Perceptual disturbances:   WNL  Orientation: oriented to person, place, and time/date  Attention: Good  Concentration: Good  Memory: WNL  Fund of knowledge:  Good  Insight:   Good  Judgment:  Good  Impulse Control: Good   Risk Assessment: Danger to Self:  No Self-injurious Behavior: No Danger to Others: No Duty to Warn:no Physical Aggression / Violence:No  Access to Firearms a concern: No  Gang Involvement:No   Subjective:   Luis Villa participated in the session, in person in the office with the therapist, and consented to treatment. Luis Villa reviewed the events of the past week. We reviewed numerous treatment approaches including CBT, BA, Problem Solving, and Solution focused therapy. Psych-education regarding the Luis Villa diagnosis of depression and anxiety was provided during the session. We discussed Luis Villa's treatment goals which include increasing his communication skills, setting goals, symptom management, and develop coping skills to be more available to his family. Luis Villa provided verbal approval of the treatment plan.     Interventions: Cognitive Behavioral Therapy   Diagnosis:   Depression and anxiety   Psychiatric Treatment: No   Treatment Plan:  Client Abilities/Strengths Luis Villa is intelligent,  self-aware, motivated for change, and committed to his family and being a Luis Villa.   Support System: Spouse and family  Client Treatment Preferences: OPT  Client Statement of Needs Luis Villa would like to learn coping skills to manage his stress and depression, symptom management, learn additional parenting skills, increase his communication with his family members.   Treatment Level Weekly/Biweekly  Symptoms  Depression: little interest in doing things, trouble sleeping, having little energy, trouble concentrating   (Status: maintained) Anxiety: feeling nervous, not being able to control worrying, trouble relaxing, becoming easily annoyed and irritable   (Status: maintained)  Goals:   Luis Villa experiences symptoms of anxiety and depression.   Target Date: 02/26/2024 Frequency: Weekly/Biweekly  Progress: 0 Modality: individual    Therapist will provide referrals for additional resources as appropriate.  Therapist will provide psycho-education regarding Luis Villa diagnosis and corresponding treatment approaches and interventions. Luis Villa will support the patient's ability to achieve the goals identified. CBT, BA, Problem-solving, Solution Focused, Mindfulness, coping skills, & other evidenced-based practices will be used to promote progress towards healthy functioning to help manage decrease symptoms associated with their diagnosis.   Reduce overall level, frequency, and intensity of the feelings of depression, anxiety and panic evidenced by decreased overall symptoms from 6 to 7 days/week to 0 to 1 days/week per client report for at least 3 consecutive months. Verbally express understanding of the relationship between feelings of depression, anxiety and their impact on thinking patterns and behaviors. Verbalize an understanding of the role that distorted thinking plays in creating fears, excessive worry, and ruminations.   Luis Villa participated in the creation of the treatment  plan)    Luis Villa

## 2023-03-16 ENCOUNTER — Ambulatory Visit: Payer: 59 | Admitting: Psychology

## 2023-03-16 DIAGNOSIS — F4323 Adjustment disorder with mixed anxiety and depressed mood: Secondary | ICD-10-CM

## 2023-03-16 NOTE — Progress Notes (Signed)
 [facility] Counselor/Therapist Progress Note  Patient ID: Luis Villa, MRN: [id number]    Date: 03/16/23  Time Spent: 9:00 am - 9:55 am : 55 minutes  Treatment Type: Individual Therapy.  Reported Symptoms: Feeling nervous, not being able to control worrying, trouble relaxing, becoming easily annoyed and irritable, little interest in doing things, trouble sleeping, having little energy, trouble concentrating.   Mental Status Exam: Appearance:  Well Groomed     Behavior: Appropriate  Motor: Normal  Speech/Language:  Normal Rate  Affect: Appropriate  Mood: normal  Thought process: normal  Thought content:   WNL  Sensory/Perceptual disturbances:   WNL  Orientation: oriented to person, place, and time/date  Attention: Good  Concentration: Good  Memory: WNL  Fund of knowledge:  Good  Insight:   Good  Judgment:  Good  Impulse Control: Good   Risk Assessment: Danger to Self:  No Self-injurious Behavior: No Danger to Others: No Duty to Warn:no Physical Aggression / Violence:No  Access to Firearms a concern: No  Gang Involvement:No   Subjective:   Luis Villa participated in the session, in person in the office with the therapist, and consented to treatment. Luis Villa reviewed the events of the past week. "I know what I need, just need to do it. If I change my diet, after 2 weeks I've changed, one week ago started diet and it's working. Patient reported a mindset of All or Nothing. Patient relayed making changes in the past, and sticking to those changes. We discussed the importance of small wins, not necessarily swinging for the fences, just need to get on base. 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   Support System: Spouse and family  Client Treatment Preferences OPT  Client Statement of Needs Luis Villa would like to learn coping skills to manage his stress and depression, symptom management, learn additional parenting skills, increase his communication with his family members.   Treatment Level Weekly /Biweekly  Symptoms  Depression: little interest in doing things, trouble sleeping, having little energy, trouble concentrating   (Status: maintained) Anxiety: feeling nervous, not being able to control worrying, trouble relaxing, becoming easily annoyed and irritable   (Status: maintained)   Goals:   Luis Villa experiences symptoms of anxiety and depression.   Target Date: 02/26/24 Frequency: Weekly/Biweekly  Progress: 0 Modality: individual    Therapist will provide referrals for additional resources as appropriate.  Therapist will provide psycho-education regarding Luis Villa's diagnosis and corresponding treatment approaches and interventions. Luis Villa will support the patient's ability to achieve the goals identified. CBT, BA, Problem-solving, Solution Focused, Mindfulness,  coping skills, & other evidenced-based practices will be used to promote progress towards healthy functioning to help manage decrease symptoms associated with their diagnosis.   Reduce overall level, frequency, and intensity of the feelings of depression, anxiety and panic evidenced by decreased overall symptoms from 6 to 7 days/week to 0 to 1 days/week per client report for at least 3 consecutive months. Verbally express understanding of the relationship between feelings of depression and anxiety and their impact on thinking patterns and behaviors. Verbalize an understanding of the role that distorted thinking plays in creating fears, excessive worry, and ruminations.  Luis Villa participated in  the creation of the treatment  plan)      Luis Villa

## 2023-03-26 ENCOUNTER — Ambulatory Visit: Payer: 59 | Admitting: Psychology

## 2023-04-06 ENCOUNTER — Ambulatory Visit: Payer: 59 | Admitting: Psychology

## 2023-04-06 ENCOUNTER — Ambulatory Visit: Admitting: Family Medicine

## 2023-04-06 DIAGNOSIS — F4323 Adjustment disorder with mixed anxiety and depressed mood: Secondary | ICD-10-CM | POA: Diagnosis not present

## 2023-04-06 NOTE — Progress Notes (Signed)
 Luis Villa  Patient ID: Luis Villa, MRN: [id number]    Date: 04/06/23  Time Spent: 9:00 am - 9: 55 am : 55 minutes  Treatment Type: Individual Therapy.  Reported Symptoms: Feeling nervous, not being able to control worrying, trouble relaxing, becoming easily annoyed and irritable, little interest in doing things, trouble sleeping, having little energy, trouble concentrating.   Mental Status Exam: Appearance:  Well Groomed     Behavior: Appropriate  Motor: Normal  Speech/Language:  Normal Rate  Affect: Appropriate  Mood: normal  Thought process: normal  Thought content:   WNL  Sensory/Perceptual disturbances:   WNL  Orientation: oriented to person, place, and time/date  Attention: Good  Concentration: Good  Memory: WNL  Fund of knowledge:  Good  Insight:   Good  Judgment:  Good  Impulse Control: Good   Risk Assessment: Danger to Self:  No Self-injurious Behavior: No Danger to Others: No Duty to Warn:no Physical Aggression / Violence:No  Access to Firearms a concern: No  Gang Involvement:No   Subjective:   Luis Villa participated in the session, in person in the office with the therapist, and consented to treatment. Luis Villa reviewed the events of the past week.   Patient doing well with small wins, family drama around word "gaslighting", patient would like family member to have an outlet and has suggested that she therapy. Issue with daughter's shin guards, patient made a comment and family member " over reacted", which led to venting. Discussed rather than responding logically, respond with more emotional language, in an effort to be heard. Patient agreed to try the emotional language. Also, a question of family member finding the time to get the help she needs, whether she realizes or not.   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   Support System: Spouse and family  Client Treatment Preferences OPT  Client Statement of Needs Luis Villa would like to learn coping skills to manage his stress and depression, symptom management, learn additional parenting skills, increase his communication with his family members.    Treatment Level Weekly/Biweekly  Symptoms  Depression: little interest in doing things, trouble sleeping, having little energy, trouble concentrating   (Status: maintained) Anxiety: feeling nervous, not being able to control worrying, trouble relaxing, becoming easily annoyed and irritable   (Status: maintained  Goals:   Luis Villa experiences symptoms of anxiety and depression.   Target Date: 02/26/24 Frequency: Weekly/Biweekly  Progress: 0 Modality: individual    Therapist will provide referrals for additional resources as appropriate.  Therapist will provide psycho-education regarding Luis Villa's diagnosis and corresponding treatment approaches and interventions. Luis Villa will support the patient's ability to achieve the goals identified. will employ CBT, BA, Problem-solving, Solution Focused, Mindfulness,  coping skills, & other evidenced-based practices will be used to promote progress towards healthy functioning to help manage decrease symptoms associated with their diagnosis.   Reduce overall level, frequency, and intensity of the feelings of depression, anxiety and panic evidenced by decreased overall symptoms from 6 to 7 days/week to 0 to 1 days/week per client report for at least 3 consecutive months. Verbally express understanding of the relationship between feelings of depression and anxiety and their impact on thinking patterns and behaviors. Verbalize an understanding of the role that distorted  thinking plays  in creating fears, excessive worry, and ruminations.  Luis Villa participated in the creation of the treatment plan)    Luis Villa

## 2023-04-09 ENCOUNTER — Encounter: Payer: Self-pay | Admitting: Family Medicine

## 2023-04-09 ENCOUNTER — Ambulatory Visit: Admitting: Family Medicine

## 2023-04-09 VITALS — BP 114/80 | HR 67 | Temp 98.2°F | Wt 224.2 lb

## 2023-04-09 DIAGNOSIS — R7989 Other specified abnormal findings of blood chemistry: Secondary | ICD-10-CM

## 2023-04-09 DIAGNOSIS — R5383 Other fatigue: Secondary | ICD-10-CM

## 2023-04-09 DIAGNOSIS — R6882 Decreased libido: Secondary | ICD-10-CM | POA: Diagnosis not present

## 2023-04-09 LAB — COMPREHENSIVE METABOLIC PANEL
ALT: 19 U/L (ref 0–53)
AST: 19 U/L (ref 0–37)
Albumin: 5.1 g/dL (ref 3.5–5.2)
Alkaline Phosphatase: 40 U/L (ref 39–117)
BUN: 17 mg/dL (ref 6–23)
CO2: 27 meq/L (ref 19–32)
Calcium: 9.5 mg/dL (ref 8.4–10.5)
Chloride: 105 meq/L (ref 96–112)
Creatinine, Ser: 1 mg/dL (ref 0.40–1.50)
GFR: 95.69 mL/min (ref >60.00)
Glucose, Bld: 81 mg/dL (ref 70–99)
Potassium: 4.4 meq/L (ref 3.5–5.1)
Sodium: 142 meq/L (ref 135–145)
Total Bilirubin: 0.9 mg/dL (ref 0.2–1.2)
Total Protein: 7.2 g/dL (ref 6.0–8.3)

## 2023-04-09 LAB — CBC WITH DIFFERENTIAL/PLATELET
Basophils Absolute: 0 10*3/uL (ref 0.0–0.1)
Basophils Relative: 0.4 % (ref 0.0–3.0)
Eosinophils Absolute: 0.1 10*3/uL (ref 0.0–0.7)
Eosinophils Relative: 2 % (ref 0.0–5.0)
HCT: 43.3 % (ref 39.0–52.0)
Hemoglobin: 15.1 g/dL (ref 13.0–17.0)
Lymphocytes Relative: 32.8 % (ref 12.0–46.0)
Lymphs Abs: 1.7 10*3/uL (ref 0.7–4.0)
MCHC: 35 g/dL (ref 30.0–36.0)
MCV: 92.4 fl (ref 78.0–100.0)
Monocytes Absolute: 0.4 10*3/uL (ref 0.1–1.0)
Monocytes Relative: 8.2 % (ref 3.0–12.0)
Neutro Abs: 2.9 10*3/uL (ref 1.4–7.7)
Neutrophils Relative %: 56.6 % (ref 43.0–77.0)
Platelets: 193 10*3/uL (ref 150.0–400.0)
RBC: 4.68 Mil/uL (ref 4.22–5.81)
RDW: 12.4 % (ref 11.5–15.5)
WBC: 5.2 10*3/uL (ref 4.0–10.5)

## 2023-04-09 LAB — TSH: TSH: 7.52 u[IU]/mL — ABNORMAL HIGH (ref 0.35–5.50)

## 2023-04-09 LAB — TESTOSTERONE: Testosterone: 252.8 ng/dL — ABNORMAL LOW (ref 300.00–890.00)

## 2023-04-09 NOTE — Progress Notes (Signed)
 Established Patient Office Visit  Subjective   Patient ID: Luis Villa, male    DOB: [birth year]  Age: 38 y.o. MRN: [id number]  Chief Complaint  Patient presents with   Labs Only    HPI   Kellar is seen to discuss issues regarding fatigue.  He specifically is concerned he may have low testosterone.  For months now he has had progressive fatigue.  He tries to follow a very healthy diet with a lot of processed foods.  Has been exercising at least couple times per week.  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  No suspicion for obstructive sleep apnea.  He has also noticed some decrease in libido past couple years.  He has history of ADD treated with Adderall.  Does not feel he has had any increase in energy since taking Adderall.  Appetite and weight stable.  No recent chest pains.  No dyspnea.  Past Medical History:  Diagnosis Date   PERS HX TOBACCO USE PRESENTING HAZARDS HEALTH 08/06/2009   Past Surgical History:  Procedure Laterality Date   TONSILLECTOMY      reports that he has quit smoking. His smoking use included cigarettes. He has a 10 pack-year smoking history. He has never used smokeless tobacco. He reports that he does not use drugs. No history on file for alcohol use. family history includes Crohn's disease in his father; Heart disease in his maternal grandfather; Hypertension in an other family member. Allergies  Allergen Reactions   Cefaclor     Ceclor= hives    Review of Systems  Constitutional:  Positive for malaise/fatigue. Negative for chills and fever.  Eyes:  Negative for blurred vision.  Respiratory:  Negative for shortness of breath.   Cardiovascular:  Negative for chest pain.  Neurological:  Negative for dizziness, weakness and headaches.      Objective:     BP 114/80 (BP Location: Left Arm, Patient Position: Sitting, Cuff Size: Large)   Pulse 67   Temp 98.2 F (36.8 C) (Oral)   Wt 224 lb 3.2 oz (101.7 kg)   SpO2  98%   BMI 32.17 kg/m  BP Readings from Last 3 Encounters:  04/09/23 114/80  02/04/22 120/82  10/10/21 120/70   Wt Readings from Last 3 Encounters:  04/09/23 224 lb 3.2 oz (101.7 kg)  02/04/22 219 lb 9.6 oz (99.6 kg)  10/10/21 211 lb 9.6 oz (96 kg)      Physical Exam Vitals reviewed.  Constitutional:      Appearance: He is well-developed.  Eyes:     Pupils: Pupils are equal, round, and reactive to light.  Neck:     Thyroid: No thyromegaly.     Comments: No thyroid nodules palpated Cardiovascular:     Rate and Rhythm: Normal rate and regular rhythm.     Heart sounds: No murmur heard. Pulmonary:     Effort: Pulmonary effort is normal. No respiratory distress.     Breath sounds: Normal breath sounds. No wheezing or rales.  Musculoskeletal:     Right lower leg: No edema.     Left lower leg: No edema.  Neurological:     Mental Status: He is alert and oriented to person, place, and time.      No results found for any visits on 04/09/23.    The ASCVD Risk score (Arnett DK, et al., 2019) failed to calculate for the following reasons:   The 2019 ASCVD risk score is only valid for  ages 33 to 19    Assessment & Plan:   Problem List Items Addressed This Visit   None Visit Diagnoses       Fatigue, unspecified type    -  Primary   Relevant Orders   CBC with Differential/Platelet   TSH   CMP   Testosterone     Low libido       Relevant Orders   Testosterone     Several month history of low libido and increasing fatigue.  Check labs as above including testosterone level.  No follow-ups on file.    Evelena Peat, MD

## 2023-04-12 ENCOUNTER — Telehealth: Payer: Self-pay

## 2023-04-12 NOTE — Addendum Note (Signed)
 Addended by: Christy Sartorius on: 04/12/2023 08:14 AM   Modules accepted: Orders

## 2023-04-12 NOTE — Telephone Encounter (Signed)
 Copied from CRM [id number]. Topic: Clinical - Lab/Test Results >> Apr 12, 2023  8:22 AM [name] wrote: Reason for CRM: Patient returning phone call from [name] - relayed lab results message. Patient wanted to let know [name] know hyperthyroidism does run in his family & he will call back to schedule recheck on labs.

## 2023-04-13 ENCOUNTER — Other Ambulatory Visit: Payer: Self-pay | Admitting: Family Medicine

## 2023-04-15 MED ORDER — AMPHETAMINE-DEXTROAMPHET ER 30 MG PO CP24: 30.0000 mg | ORAL_CAPSULE | ORAL | 0 refills | Status: DC

## 2023-04-15 MED ORDER — AMPHETAMINE-DEXTROAMPHET ER 30 MG PO CP24
30.0000 mg | ORAL_CAPSULE | ORAL | 0 refills | Status: DC
Start: 2023-04-15 — End: 2023-08-04

## 2023-04-27 ENCOUNTER — Ambulatory Visit: Payer: 59 | Admitting: Psychology

## 2023-04-27 DIAGNOSIS — F4323 Adjustment disorder with mixed anxiety and depressed mood: Secondary | ICD-10-CM

## 2023-04-27 NOTE — Progress Notes (Unsigned)
 [facility] Counselor/Therapist Progress Note  Patient ID: Luis Villa, MRN: [id number]    Date: 04/27/23  Time Spent: 9:00 am - 9: 53 am :  Treatment Type: Individual Therapy.  Reported Symptoms: Feeling nervous, not being able to control worrying, trouble relaxing, becoming easily annoyed and irritable, little interest in doing things, trouble sleeping, having little energy, trouble concentrating.   Mental Status Exam: Appearance:  Well Groomed     Behavior: Appropriate  Motor:  Normal  Speech/Language:  Normal Rate  Affect: Appropriate  Mood: normal  Thought process: normal  Thought content:   WNL  Sensory/Perceptual disturbances:   WNL  Orientation: oriented to person, place, and time/date  Attention: Good  Concentration: Good  Memory: WNL  Fund of knowledge:  Good  Insight:   Good  Judgment:  Good  Impulse Control: Good   Risk Assessment: Danger to Self:  No Self-injurious Behavior: No Danger to Others: No Duty to Warn:no Physical Aggression / Violence:No  Access to Firearms a concern: No  Gang Involvement:No   Subjective:   Vivianne Spence participated in the session, in person in the office with the therapist, and consented to treatment. Edin reviewed the events of the past week. Patient has been using emotional language as opposed to his typical logical response. It worked to "a certain extent", and patient plans to continue to use that. Patient had the great insight to get his thyroid checked by his primary care physician and it was low enough to warrant treatment. Patient desvribes taking a different, more healthy, approach to responding to stressful situarions.   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    Support System: Spouse and family  Client Treatment Preferences OPT  Client Statement of Needs Aum would like to  learn coping skills to manage his stress and depression, symptom management, learn additional parenting skills, increase his communication with his family members.     Treatment Level Weekly/Biweekly  Symptoms  Depression: little interest in doing things, trouble sleeping, having little energy, trouble concentrating   (Status: maintained) Anxiety: feeling nervous, not being able to control worrying, trouble relaxing, becoming easily annoyed and irritable   (Status: maintained)  Goals:   Hilmar experiences symptoms of anxiety and depression.   Target Date: 02/26/24 Frequency: Weekly  Progress: 0 Modality: individual    Therapist will provide referrals for additional resources as appropriate.  Therapist will provide psycho-education regarding Deniro's diagnosis and corresponding treatment approaches and interventions. Helyn Numbers will support the patient's ability to achieve the goals identified. will employ CBT, BA, Problem-solving, Solution Focused, Mindfulness,  coping skills, & other evidenced-based practices will be used to promote progress towards healthy functioning to help manage decrease symptoms associated with their diagnosis.   Reduce overall level, frequency, and intensity of the feelings of depression, anxiety and panic evidenced by decreased overall symptoms from 6 to 7 days/week to 0 to 1 days/week per client report for at least 3 consecutive months. Verbally express an understanding of the relationship between feelings of depression and anxiety and their impact on thinking patterns and behaviors. Verbalize an understanding of the role that distorted thinking plays in creating fears, excessive worry, and ruminations.  Judie Grieve participated in the creation of the treatment plan)  Helyn Numbers

## 2023-04-30 ENCOUNTER — Encounter: Payer: Self-pay | Admitting: Family Medicine

## 2023-04-30 ENCOUNTER — Ambulatory Visit: Admitting: Family Medicine

## 2023-04-30 VITALS — BP 118/66 | HR 66 | Temp 98.2°F | Wt 223.0 lb

## 2023-04-30 DIAGNOSIS — R5383 Other fatigue: Secondary | ICD-10-CM | POA: Diagnosis not present

## 2023-04-30 DIAGNOSIS — R7989 Other specified abnormal findings of blood chemistry: Secondary | ICD-10-CM

## 2023-04-30 LAB — TSH: TSH: 4.93 u[IU]/mL (ref 0.35–5.50)

## 2023-04-30 LAB — TESTOSTERONE: Testosterone: 296.89 ng/dL — ABNORMAL LOW (ref 300.00–890.00)

## 2023-04-30 LAB — FOLLICLE STIMULATING HORMONE: FSH: 2.3 m[IU]/mL (ref 1.4–18.1)

## 2023-04-30 LAB — LUTEINIZING HORMONE: LH: 2.19 m[IU]/mL (ref 1.50–9.30)

## 2023-04-30 LAB — T4, FREE: Free T4: 0.74 ng/dL (ref 0.60–1.60)

## 2023-04-30 NOTE — Progress Notes (Signed)
 Established Patient Office Visit  Subjective   Patient ID: Luis Villa, male    DOB: 12-05-1985  Age: 38 y.o. MRN: [id number]  Chief Complaint  Patient presents with   Medical Management of Chronic Issues    HPI   Luis Villa is seen for follow-up from recent visit for fatigue and low libido.  CBC and CMP were normal.  TSH was elevated 7.52.  He does have strong family history of hypothyroidism in both parents.  He also had low early morning testosterone level of 252.  We had advised repeat testosterone along with FSH, LH, prolactin, and repeat TSH and free T4.  He is here for labs today for that.  No other overt symptoms of hypothyroidism  Past Medical History:  Diagnosis Date   PERS HX TOBACCO USE PRESENTING HAZARDS HEALTH 08/06/2009   Past Surgical History:  Procedure Laterality Date   TONSILLECTOMY      reports that he has quit smoking. His smoking use included cigarettes. He has a 10 pack-year smoking history. He has never used smokeless tobacco. He reports that he does not use drugs. No history on file for alcohol use. family history includes Crohn's disease in his father; Heart disease in his maternal grandfather; Hypertension in an other family member. Allergies  Allergen Reactions   Cefaclor     Ceclor= hives    Review of Systems  Constitutional:  Positive for malaise/fatigue. Negative for weight loss.  Respiratory:  Negative for shortness of breath.   Cardiovascular:  Negative for chest pain.      Objective:     BP 118/66 (BP Location: Left Arm, Patient Position: Sitting, Cuff Size: Large)   Pulse 66   Temp 98.2 F (36.8 C) (Oral)   Wt 223 lb (101.2 kg)   SpO2 99%   BMI 32.00 kg/m  BP Readings from Last 3 Encounters:  04/30/23 118/66  04/09/23 114/80  02/04/22 120/82   Wt Readings from Last 3 Encounters:  04/30/23 223 lb (101.2 kg)  04/09/23 224 lb 3.2 oz (101.7 kg)  02/04/22 219 lb 9.6 oz (99.6 kg)      Physical Exam Vitals reviewed.   Constitutional:      General: He is not in acute distress.    Appearance: He is not ill-appearing.  Cardiovascular:     Rate and Rhythm: Normal rate and regular rhythm.  Pulmonary:     Effort: Pulmonary effort is normal.     Breath sounds: Normal breath sounds. No wheezing or rales.  Neurological:     Mental Status: He is alert.      No results found for any visits on 04/30/23.  Last CBC Lab Results  Component Value Date   WBC 5.2 04/09/2023   HGB 15.1 04/09/2023   HCT 43.3 04/09/2023   MCV 92.4 04/09/2023   RDW 12.4 04/09/2023   PLT 193.0 04/09/2023   Last metabolic panel Lab Results  Component Value Date   GLUCOSE 81 04/09/2023   NA 142 04/09/2023   K 4.4 04/09/2023   CL 105 04/09/2023   CO2 27 04/09/2023   BUN 17 04/09/2023   CREATININE 1.00 04/09/2023   GFR 95.69 04/09/2023   CALCIUM 9.5 04/09/2023   PROT 7.2 04/09/2023   ALBUMIN 5.1 04/09/2023   BILITOT 0.9 04/09/2023   ALKPHOS 40 04/09/2023   AST 19 04/09/2023   ALT 19 04/09/2023   Last thyroid functions Lab Results  Component Value Date   TSH 7.52 (H) 04/09/2023  The ASCVD Risk score (Arnett DK, et al., 2019) failed to calculate for the following reasons:   The 2019 ASCVD risk score is only valid for ages 89 to 39    Assessment & Plan:   #1 progressive fatigue and low libido possibly related to issues below with abnormal TSH and low testosterone  #2 elevated TSH with strong family history of hypothyroidism in both parents.  Recheck TSH today along with free T4.  Consider initiating therapy especially if TSH remains elevated and free T4 low.    #3 low testosterone.  Rechecking total testosterone this morning along with FSH, LH, and prolactin.  If this confirms low level he is very interested in considering initiating treatment.  We discussed various options.  Discussed risk and benefits of testosterone replacement   No follow-ups on file.    Evelena Peat, MD

## 2023-05-01 LAB — PROLACTIN: Prolactin: 7 ng/mL (ref 2.0–18.0)

## 2023-05-02 MED ORDER — TESTOSTERONE CYPIONATE 200 MG/ML IM SOLN: 200.0000 mg | INTRAMUSCULAR | 0 refills | Status: DC

## 2023-05-02 NOTE — Addendum Note (Signed)
 Addended by: Kristian Covey on: 05/02/2023 12:51 PM   Modules accepted: Orders

## 2023-05-03 MED ORDER — BD SYRINGE LUER-LOK 3 ML MISC: 0 refills | Status: DC

## 2023-05-03 MED ORDER — BD DISP NEEDLES 22G X 1-1/2" MISC: 0 refills | Status: AC

## 2023-05-03 NOTE — Addendum Note (Signed)
 Addended by: Christy Sartorius on: 05/03/2023 08:48 AM   Modules accepted: Orders

## 2023-05-18 ENCOUNTER — Ambulatory Visit: Admitting: Psychology

## 2023-05-18 DIAGNOSIS — F4323 Adjustment disorder with mixed anxiety and depressed mood: Secondary | ICD-10-CM | POA: Diagnosis not present

## 2023-05-18 NOTE — Progress Notes (Signed)
 [facility] Counselor/Therapist Progress Note  Patient ID: Luis Villa, MRN: [id number]    Date: 05/18/23  Time Spent: 9:00 am - 9:55 am : 55 minutes   Treatment Type: Individual Therapy.  Reported Symptoms: Feeling nervous, not being able to control worrying, trouble relaxing, becoming easily annoyed and irritable, little interest in doing things, trouble sleeping, having little energy, trouble   Mental Status Exam: Appearance:  Well Groomed     Behavior: Appropriate  Motor: Normal  Speech/Language:  Normal Rate  Affect: Appropriate  Mood: normal  Thought process: normal  Thought content:   WNL  Sensory/Perceptual disturbances:   WNL  Orientation: oriented to person, place, and time/date  Attention: Good  Concentration: Good  Memory: WNL  Fund of knowledge:  Good  Insight:   Good  Judgment:  Good  Impulse Control: Good   Risk Assessment: Danger to Self:  No Self-injurious Behavior: No Danger to Others: No Duty to Warn:no Physical Aggression / Violence:No  Access to Firearms a concern: No  Gang Involvement:No   Subjective:   Merrilee Abernethy participated in the session, in person in the office with the therapist, and consented to treatment. Brewer reviewed the events of the past week.   Patient's testosterone  level is better, due to injections, patient feeling some improvement already. Patient's thyroid went from hypothyroid to WNL albeit higher level WNL, will have another test soon. Wondering if his sleep might be the reason his thyroid results have been fluctuating. Patient reported making positive changes in his personal life.   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     Support  System: Spouse and family  Client Treatment Preferences OPT  Client Statement of Needs Amman would like to learn coping skills to manage his stress and depression, symptom management, learn additional parenting skills, increase his communication with his family members.    Treatment Level Weekly/Biweekly  Symptoms  Depression: little interest in doing things, trouble sleeping, having little energy, trouble concentrating   (Status: maintained) Anxiety: feeling nervous, not being able to control worrying, trouble relaxing, becoming easily annoyed and irritable   (Status: maintained)  Goals:   Toney experiences symptoms of anxiety and depression.   Target Date: 02/26/24 Frequency: Weekly/Biweekly  Progress: 0 Modality: individual    Therapist will provide referrals for additional resources as appropriate.  Therapist will provide psycho-education regarding Trayce's diagnosis and corresponding treatment approaches and interventions. Fran Imus will support the patient's ability to achieve the goals identified. will employ CBT, BA, Problem-solving, Solution Focused, Mindfulness,  coping skills, & other evidenced-based practices will be used to promote progress towards healthy functioning to help manage decrease symptoms associated with their diagnosis.   Reduce overall level, frequency, and intensity of the feelings of depression, anxiety and panic evidenced by decreased overall symptoms from 6 to 7 days/week to 0 to 1 days/week per client report for at least 3 consecutive months. Verbally express understanding of the relationship between feelings of depression and anxiety and their impact on thinking patterns and behaviors. Verbalize an understanding of the role that distorted thinking plays in creating fears, excessive worry, and ruminations.  [name] participated in the creation of the treatment plan)    Fran Imus

## 2023-05-19 ENCOUNTER — Other Ambulatory Visit: Payer: Self-pay | Admitting: Family Medicine

## 2023-05-24 MED ORDER — TESTOSTERONE CYPIONATE 200 MG/ML IM SOLN: 200.0000 mg | INTRAMUSCULAR | 0 refills | Status: DC

## 2023-06-29 ENCOUNTER — Ambulatory Visit: Admitting: Psychology

## 2023-06-29 DIAGNOSIS — F4323 Adjustment disorder with mixed anxiety and depressed mood: Secondary | ICD-10-CM | POA: Diagnosis not present

## 2023-06-29 NOTE — Progress Notes (Unsigned)
 [facility] Counselor/Therapist Progress Note  Patient ID: Luis Villa, MRN: [id number]    Date: 06/29/23  Time Spent: 9:00 am - 9:55 am : 55  minutes  Treatment Type: Individual Therapy.  Reported Symptoms: Feeling nervous, not being able to control worrying, trouble relaxing, becoming easily annoyed and irritable, little interest in doing things, trouble sleeping, having little energy, trouble   Mental Status Exam: Appearance:  Well Groomed     Behavior: Appropriate  Motor: Normal  Speech/Language:  Normal Rate  Affect: Appropriate  Mood: normal  Thought process: normal  Thought content:   WNL  Sensory/Perceptual disturbances:   WNL  Orientation: oriented to person, place, and time/date  Attention: Good  Concentration: Good  Memory: WNL  Fund of knowledge:  Good  Insight:   Good  Judgment:  Good  Impulse Control: Good   Risk Assessment: Danger to Self: No Self-injurious Behavior: No Danger to Others: No Duty to Warn:no Physical Aggression / Violence:No  Access to Firearms a concern: No  Gang Involvement:No   Subjective:   Luis Villa participated in the session, in person in the office with the therapist, and consented to treatment. Luis Villa reviewed the events of the past week.   Off the truck starting on June 10.   June 2 has lab work scheduled for thyroid.  Pt will email this Clinical research associate w new job schedule to arrange next appt.   Email pt w Luis Villa's info for wife Luis Villa.   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    Treatment Level Every 3 weeks  Symptoms Depression: little interest in doing things, trouble sleeping, having little energy, trouble concentrating   (Status: maintained) Anxiety: feeling nervous, not being able to control worrying, trouble relaxing, becoming easily annoyed and irritable   (Status: maintained)  Goals:   Luis Villa experiences symptoms of anxiety and depression..  Target Date: 02/26/24 Frequency: Every 3 weekw  Progress: 0 Modality: individual    Therapist will provide referrals for additional resources as appropriate.  Therapist will provide psycho-education regarding [name]'s diagnosis and corresponding treatment approaches and interventions. Fran Imus will support the patient's ability to achieve the goals identified. will employ CBT, BA, Problem-solving, Solution Focused, Mindfulness,  coping skills, & other evidenced-based practices will be used to promote progress towards healthy functioning to help manage decrease symptoms associated with their diagnosis.   Reduce overall level, frequency, and intensity of the feelings of depression, anxiety and panic evidenced by decreased overall symptoms from 6 to 7 days/week to 0 to 1 days/week per client report for at least 3 consecutive months. Verbally express understanding of the relationship between feelings of depression and anxiety and their impact on thinking patterns and behaviors. Verbalize an understanding of the role that distorted thinking plays in creating fears, excessive worry, and ruminations.  [name] participated in the creation of the treatment plan)   Fran Imus

## 2023-07-05 ENCOUNTER — Ambulatory Visit: Admitting: Family Medicine

## 2023-07-05 ENCOUNTER — Other Ambulatory Visit: Payer: Self-pay

## 2023-07-05 ENCOUNTER — Encounter: Payer: Self-pay | Admitting: Family Medicine

## 2023-07-05 ENCOUNTER — Ambulatory Visit: Payer: Self-pay | Admitting: Family Medicine

## 2023-07-05 VITALS — BP 120/80 | HR 64 | Temp 98.3°F | Wt 224.2 lb

## 2023-07-05 DIAGNOSIS — R5383 Other fatigue: Secondary | ICD-10-CM | POA: Diagnosis not present

## 2023-07-05 DIAGNOSIS — R7989 Other specified abnormal findings of blood chemistry: Secondary | ICD-10-CM | POA: Diagnosis not present

## 2023-07-05 LAB — CBC WITH DIFFERENTIAL/PLATELET
Basophils Absolute: 0 10*3/uL (ref 0.0–0.1)
Basophils Relative: 0.7 % (ref 0.0–3.0)
Eosinophils Absolute: 0.2 10*3/uL (ref 0.0–0.7)
Eosinophils Relative: 4.2 % (ref 0.0–5.0)
HCT: 46.9 % (ref 39.0–52.0)
Hemoglobin: 16.2 g/dL (ref 13.0–17.0)
Lymphocytes Relative: 31.1 % (ref 12.0–46.0)
Lymphs Abs: 1.5 10*3/uL (ref 0.7–4.0)
MCHC: 34.5 g/dL (ref 30.0–36.0)
MCV: 92.2 fl (ref 78.0–100.0)
Monocytes Absolute: 0.5 10*3/uL (ref 0.1–1.0)
Monocytes Relative: 10.4 % (ref 3.0–12.0)
Neutro Abs: 2.5 10*3/uL (ref 1.4–7.7)
Neutrophils Relative %: 53.6 % (ref 43.0–77.0)
Platelets: 190 10*3/uL (ref 150.0–400.0)
RBC: 5.09 Mil/uL (ref 4.22–5.81)
RDW: 12.1 % (ref 11.5–15.5)
WBC: 4.7 10*3/uL (ref 4.0–10.5)

## 2023-07-05 LAB — TSH: TSH: 5.16 u[IU]/mL (ref 0.35–5.50)

## 2023-07-05 LAB — T4, FREE: Free T4: 0.79 ng/dL (ref 0.60–1.60)

## 2023-07-05 LAB — TESTOSTERONE: Testosterone: 336.7 ng/dL (ref 300.00–890.00)

## 2023-07-05 MED ORDER — TESTOSTERONE CYPIONATE 100 MG/ML IM SOLN: 150.0000 mg | INTRAMUSCULAR | 2 refills | Status: DC

## 2023-07-05 MED ORDER — BD SYRINGE LUER-LOK 3 ML MISC: 3 refills | Status: AC

## 2023-07-05 NOTE — Progress Notes (Signed)
 Established Patient Office Visit  Subjective   Patient ID: Luis Villa, male    DOB: August 31, 1985  Age: 38 y.o. MRN: [id number]  Chief Complaint  Patient presents with   Medical Management of Chronic Issues    HPI   Luis Villa is seen for follow-up regarding low testosterone .  We initially placed him on testosterone  200 mg every 2 weeks but he had excessive fatigue especially when nearing trough levels at the end of 2 weeks.  We then adjusted this to 100 mg every week.  He does feel better overall but still has some highs and lows.  Still feels a bit fatigued near the end of each week.  He is due for repeat testosterone  injection today and today's labs will be a trough level.  Also needs follow-up CBC.  He had recent TSH over 7.  Subsequent TSH was under 5.  Does have family history of hypothyroidism.  He is requesting repeat thyroid studies today as well.  Past Medical History:  Diagnosis Date   PERS HX TOBACCO USE PRESENTING HAZARDS HEALTH 08/06/2009   Past Surgical History:  Procedure Laterality Date   TONSILLECTOMY      reports that he has quit smoking. His smoking use included cigarettes. He has a 10 pack-year smoking history. He has never used smokeless tobacco. He reports that he does not use drugs. No history on file for alcohol use. family history includes Crohn's disease in his father; Heart disease in his maternal grandfather; Hypertension in an other family member. Allergies  Allergen Reactions   Cefaclor     Ceclor= hives    Review of Systems  Constitutional:  Positive for malaise/fatigue. Negative for weight loss.  Cardiovascular:  Negative for chest pain.      Objective:     BP 120/80 (BP Location: Left Arm, Patient Position: Sitting, Cuff Size: Large)   Pulse 64   Temp 98.3 F (36.8 C) (Oral)   Wt 224 lb 3.2 oz (101.7 kg)   SpO2 98%   BMI 32.17 kg/m  BP Readings from Last 3 Encounters:  07/05/23 120/80  04/30/23 118/66  04/09/23 114/80   Wt Readings  from Last 3 Encounters:  07/05/23 224 lb 3.2 oz (101.7 kg)  04/30/23 223 lb (101.2 kg)  04/09/23 224 lb 3.2 oz (101.7 kg)      Physical Exam Vitals reviewed.  Constitutional:      Appearance: He is not ill-appearing.  Cardiovascular:     Rate and Rhythm: Normal rate and regular rhythm.     Heart sounds: No murmur heard. Pulmonary:     Effort: Pulmonary effort is normal.     Breath sounds: Normal breath sounds. No wheezing or rales.  Neurological:     Mental Status: He is alert.      No results found for any visits on 07/05/23.    The ASCVD Risk score (Arnett DK, et al., 2019) failed to calculate for the following reasons:   The 2019 ASCVD risk score is only valid for ages 72 to 46    Assessment & Plan:   Problem List Items Addressed This Visit   None Visit Diagnoses       Low testosterone     -  Primary   Relevant Orders   CBC with Differential/Platelet   Testosterone      Fatigue, unspecified type       Relevant Orders   TSH   T4, Free     Luis Villa is seen today for follow-up regarding low  testosterone .  Recently went on therapy and is currently on 100 mg IM once weekly.  Recheck total testosterone  today along with CBC.  Also repeating TSH and free T4 per patient request.  He had recent borderline elevated TSH following elevated TSH.  His free T4 was normal, but in lower range of normal.   No follow-ups on file.    Luis Lamy, MD

## 2023-07-05 NOTE — Telephone Encounter (Signed)
 Since we are changing his dose of testosterone  to 150 mg every 7 days, sending in testosterone  100 mg concentration to take 1.5 ml every 7 days.  I think that may be easier to administer than take 0.75 ml of the 200 mg concentration.  Make sure patient aware of change in concentration.  Marquetta Sit MD [facility]

## 2023-07-05 NOTE — Addendum Note (Signed)
 Addended by: Aurelio Leer on: 07/05/2023 01:53 PM   Modules accepted: Orders

## 2023-07-06 NOTE — Telephone Encounter (Signed)
 Patient is aware

## 2023-07-20 ENCOUNTER — Ambulatory Visit: Admitting: Psychology

## 2023-08-03 ENCOUNTER — Other Ambulatory Visit: Payer: Self-pay | Admitting: Family Medicine

## 2023-08-04 MED ORDER — AMPHETAMINE-DEXTROAMPHET ER 30 MG PO CP24: 30.0000 mg | ORAL_CAPSULE | ORAL | 0 refills | Status: DC

## 2023-08-16 ENCOUNTER — Other Ambulatory Visit: Payer: Self-pay | Admitting: Family Medicine

## 2023-08-24 NOTE — Telephone Encounter (Unsigned)
 Copied from CRM #[id number]. Topic: Clinical - Medication Refill >> Aug 24, 2023  2:18 PM Harlene ORN wrote: Medication: NEEDLE, DISP, 22 G (BD DISP NEEDLES) 22G X 1-1/2 MISC  Has the patient contacted their pharmacy? No (Agent: If no, request that the patient contact the pharmacy for the refill. If patient does not wish to contact the pharmacy document the reason why and proceed with request.) (Agent: If yes, when and what did the pharmacy advise?)  This is the patient's preferred pharmacy:  [facility] [address] [facility] - [place], [place] - [address] [facility] [address] [facility] [address] [place] [address] Phone: [phone] Fax: [phone]  Is this the correct pharmacy for this prescription? Yes If no, delete pharmacy and type the correct one.   Has the prescription been filled recently? No  Is the patient out of the medication? Yes  Has the patient been seen for an appointment in the last year OR does the patient have an upcoming appointment? Yes  Can we respond through MyChart? Yes  Agent: Please be advised that Rx refills may take up to 3 business days. We ask that you follow-up with your pharmacy.

## 2023-08-27 ENCOUNTER — Encounter: Payer: Self-pay | Admitting: Family Medicine

## 2023-08-30 MED ORDER — BD DISP NEEDLES 21G X 1-1/2" MISC: 0 refills | Status: DC

## 2023-09-15 ENCOUNTER — Encounter: Payer: Self-pay | Admitting: Family Medicine

## 2023-09-15 ENCOUNTER — Ambulatory Visit: Admitting: Family Medicine

## 2023-09-15 VITALS — BP 128/80 | HR 59 | Temp 97.9°F | Wt 230.3 lb

## 2023-09-15 DIAGNOSIS — R7989 Other specified abnormal findings of blood chemistry: Secondary | ICD-10-CM | POA: Insufficient documentation

## 2023-09-15 LAB — CBC WITH DIFFERENTIAL/PLATELET
Basophils Absolute: 0 K/uL (ref 0.0–0.1)
Basophils Relative: 0.8 % (ref 0.0–3.0)
Eosinophils Absolute: 0.2 K/uL (ref 0.0–0.7)
Eosinophils Relative: 3.3 % (ref 0.0–5.0)
HCT: 46.7 % (ref 39.0–52.0)
Hemoglobin: 15.8 g/dL (ref 13.0–17.0)
Lymphocytes Relative: 24.1 % (ref 12.0–46.0)
Lymphs Abs: 1.2 K/uL (ref 0.7–4.0)
MCHC: 33.8 g/dL (ref 30.0–36.0)
MCV: 94.5 fl (ref 78.0–100.0)
Monocytes Absolute: 0.6 K/uL (ref 0.1–1.0)
Monocytes Relative: 11.7 % (ref 3.0–12.0)
Neutro Abs: 3 K/uL (ref 1.4–7.7)
Neutrophils Relative %: 60.1 % (ref 43.0–77.0)
Platelets: 193 K/uL (ref 150.0–400.0)
RBC: 4.94 Mil/uL (ref 4.22–5.81)
RDW: 12.6 % (ref 11.5–15.5)
WBC: 5 K/uL (ref 4.0–10.5)

## 2023-09-15 LAB — TESTOSTERONE: Testosterone: 383.4 ng/dL (ref 300.00–890.00)

## 2023-09-15 NOTE — Progress Notes (Signed)
 Established Patient Office Visit  Subjective   Patient ID: Luis Villa, male    DOB: [birth year]  Age: 38 y.o. MRN: [id number]  Chief Complaint  Patient presents with   Medical Management of Chronic Issues    HPI   Brayn is seen for follow-up regarding low testosterone .  We had recently increased his doses to 150 mg once a week and he is feeling better overall.  His mood is improved.  He has noticed improved recovery following workouts.  Has more energy.  Last testosterone  level still low end of normal at 336.  He did have some slight increase in hematocrit but not worrisome.  Needs follow-up labs today including CBC and testosterone  level.  His last steroid injection was about 8 days ago.  Denies any adverse symptoms from testosterone   Past Medical History:  Diagnosis Date   PERS HX TOBACCO USE PRESENTING HAZARDS HEALTH 08/06/2009   Past Surgical History:  Procedure Laterality Date   TONSILLECTOMY      reports that he has quit smoking. His smoking use included cigarettes. He has a 10 pack-year smoking history. He has never used smokeless tobacco. He reports that he does not use drugs. No history on file for alcohol use. family history includes Crohn's disease in his father; Heart disease in his maternal grandfather; Hypertension in an other family member. Allergies  Allergen Reactions   Cefaclor     Ceclor= hives    Review of Systems  Constitutional:  Negative for malaise/fatigue.  Eyes:  Negative for blurred vision.  Respiratory:  Negative for shortness of breath.   Cardiovascular:  Negative for chest pain.  Neurological:  Negative for dizziness, weakness and headaches.      Objective:     BP 128/80   Pulse (!) 59   Temp 97.9 F (36.6 C) (Oral)   Wt 230 lb 4.8 oz (104.5 kg)   SpO2 99%   BMI 33.04 kg/m    Physical Exam Vitals reviewed.  Constitutional:      Appearance: He is well-developed. He is not ill-appearing or toxic-appearing.  HENT:     Right  Ear: External ear normal.     Left Ear: External ear normal.  Eyes:     Pupils: Pupils are equal, round, and reactive to light.  Neck:     Thyroid : No thyromegaly.  Cardiovascular:     Rate and Rhythm: Normal rate and regular rhythm.  Pulmonary:     Effort: Pulmonary effort is normal. No respiratory distress.     Breath sounds: Normal breath sounds. No wheezing or rales.  Musculoskeletal:     Cervical back: Neck supple.  Neurological:     Mental Status: He is alert and oriented to person, place, and time.      No results found for any visits on 09/15/23.  Last CBC Lab Results  Component Value Date   WBC 4.7 07/05/2023   HGB 16.2 07/05/2023   HCT 46.9 07/05/2023   MCV 92.2 07/05/2023   RDW 12.1 07/05/2023   PLT 190.0 07/05/2023   Last thyroid  functions Lab Results  Component Value Date   TSH 5.16 07/05/2023      The ASCVD Risk score (Arnett DK, et al., 2019) failed to calculate for the following reasons:   The 2019 ASCVD risk score is only valid for ages 76 to 38    Assessment & Plan:   Low testosterone .  Other hormonal assessment including thyroid , prolactin, FSH, LH all normal.  Patient has had some  symptomatic improvement since increasing dose to 150 mg every week.  Recheck CBC today along with total testosterone level..  If labs stable can probably wait 6 months to recheck CBC and testosterone  Wolm Scarlet, MD

## 2023-09-17 ENCOUNTER — Ambulatory Visit: Admitting: Family Medicine

## 2023-10-14 ENCOUNTER — Other Ambulatory Visit: Payer: Self-pay | Admitting: Family Medicine

## 2023-11-01 ENCOUNTER — Ambulatory Visit (INDEPENDENT_AMBULATORY_CARE_PROVIDER_SITE_OTHER): Admitting: Psychology

## 2023-11-01 DIAGNOSIS — F1920 Other psychoactive substance dependence, uncomplicated: Secondary | ICD-10-CM | POA: Diagnosis not present

## 2023-11-01 NOTE — Progress Notes (Signed)
 [facility] Counselor/Therapist Progress Note  Patient ID: Luis Villa, MRN: [id number]    Date: 11/01/23  Time Spent: 11:00 am - 11:53 am : 53 minutes    Treatment Type: Individual Therapy.  Reported Symptoms: Patient and his family are worried about his substance dependence.   Mental Status Exam: Appearance:  Well Groomed     Behavior: Appropriate  Motor: Normal  Speech/Language:  Normal Rate  Affect: Appropriate  Mood: normal  Thought process: normal  Thought content:   WNL  Sensory/Perceptual disturbances:   WNL  Orientation: oriented to person, place, and time/date  Attention: Good  Concentration: Good  Memory: WNL  Fund of knowledge:  Good  Insight:   Good  Judgment:  Good  Impulse Control: Good   Risk Assessment: Danger to Self:  No Self-injurious Behavior: No Danger to Others: No Duty to Warn:no Physical Aggression / Violence:No  Access to Firearms a concern: No  Gang Involvement:No   Subjective:   Luis Villa participated from car, via video, and consented to treatment. I discussed the limitations of evaluation and management by telemedicine and the availability of in person appointments. The patient expressed understanding and agreed to proceed.  Therapist participated from home office.  Luis Villa reviewed the events of the past week.   Patient had not scheduled an appointment with this writer in four months, since 06/29/23. Patient's return to therapy stemmed from he and his family being concerned about his recent behavior. Patient committed to attending a support group while continuing to have individual therapy with this Clinical research associate. This Clinical research associate gave patient a Villa to a support group just for people in similar professions.   Interventions: Cognitive Behavioral Therapy  Diagnosis: Substance dependence, daily use (HCC) [F19.20]   Psychiatric Treatment: No   Treatment Plan:  Client Abilities/Strengths Luis Villa is intelligent and committed to  keeping his priorities straight.   Support System: Family  Client Treatment Preferences OPT  Client Statement of Needs Luis Villa would like to learn coping skills to manage his stress, depression and substance dependence.   Treatment Level Weekly  Symptoms  Substance Dependence  Goals:   Target Date: 02/26/24 Frequency: Weekly  Progress: 0 Modality: individual    Therapist will provide referrals for additional resources as appropriate.  Therapist will provide psycho-education regarding Luis Villa's diagnosis and corresponding treatment approaches and interventions. Luis Villa will support the patient's ability to achieve the goals identified. will employ CBT, BA, Problem-solving, Solution Focused, Mindfulness,  coping skills, & other evidenced-based practices will be used to promote progress towards healthy functioning to help manage decrease symptoms associated with their diagnosis.   Reduce overall level, frequency, and intensity of the feelings of depression, anxiety and panic evidenced by decreased overall symptoms from 6 to 7 days/week to 0 to 1 days/week per client report for at least 3 consecutive months. Verbally express understanding of the relationship between feelings of substance dependence and it's impact on thinking patterns and behaviors. Verbalize an understanding of the role that distorted thinking plays in creating fears, excessive worry, and ruminations.  Luis Villa participated in the creation of the treatment plan)    Luis Villa

## 2023-11-05 ENCOUNTER — Other Ambulatory Visit: Payer: Self-pay | Admitting: Family Medicine

## 2023-11-05 NOTE — Telephone Encounter (Unsigned)
 Copied from CRM [id number]. Topic: Clinical - Medication Refill >> Nov 05, 2023  3:43 PM Berneda F wrote: Medication: amphetamine -dextroamphetamine (ADDERALL XR) 30 MG 24 hr capsule  Has the patient contacted their pharmacy? Yes (Agent: If no, request that the patient contact the pharmacy for the refill. If patient does not wish to contact the pharmacy document the reason why and proceed with request.) (Agent: If yes, when and what did the pharmacy advise?)  This is the patient's preferred pharmacy:  [facility] [address] [facility] - [place], [place] - [address] [facility] AT [facility] [address] [place] [address] Phone: [phone] Fax: [phone]  Is this the correct pharmacy for this prescription? Yes If no, delete pharmacy and type the correct one.   Has the prescription been filled recently? No  Is the patient out of the medication? No  Has the patient been seen for an appointment in the last year OR does the patient have an upcoming appointment? Yes  Can we respond through MyChart? Yes  Agent: Please be advised that Rx refills may take up to 3 business days. We ask that you follow-up with your pharmacy.

## 2023-11-08 ENCOUNTER — Ambulatory Visit: Admitting: Psychology

## 2023-11-08 MED ORDER — AMPHETAMINE-DEXTROAMPHET ER 30 MG PO CP24: 30.0000 mg | ORAL_CAPSULE | Freq: Every morning | ORAL | 0 refills | Status: DC

## 2023-11-08 MED ORDER — AMPHETAMINE-DEXTROAMPHET ER 30 MG PO CP24: 30.0000 mg | ORAL_CAPSULE | ORAL | 0 refills | Status: DC

## 2023-11-10 ENCOUNTER — Ambulatory Visit (INDEPENDENT_AMBULATORY_CARE_PROVIDER_SITE_OTHER): Admitting: Psychology

## 2023-11-10 ENCOUNTER — Encounter: Payer: Self-pay | Admitting: Psychology

## 2023-11-10 DIAGNOSIS — F1920 Other psychoactive substance dependence, uncomplicated: Secondary | ICD-10-CM | POA: Diagnosis not present

## 2023-11-10 NOTE — Progress Notes (Signed)
 [facility] Counselor/Therapist Progress Note  Patient ID: Luis Villa, MRN: [id number]    Date: 11/10/23  Time Spent: 3:00 pm - 3:53 pm : 53 minutes   Treatment Type: Individual Therapy.  Reported Symptoms: Patient and his family are worried about his substance dependence.  Mental Status Exam: Appearance:  NA     Behavior: Appropriate  Motor: Normal  Speech/Language:  Clear and Coherent  Affect: Appropriate  Mood: normal  Thought process: normal  Thought content:   WNL  Sensory/Perceptual disturbances:   WNL  Orientation: oriented to person, place, and time/date  Attention: Good  Concentration: Good  Memory: WNL  Fund of knowledge:  Good  Insight:   Good  Judgment:  Good  Impulse Control: Good   Risk Assessment: Danger to Self:  No Self-injurious Behavior: No Danger to Others: No Duty to Warn:no Physical Aggression / Violence:No  Access to Firearms a concern: No  Gang Involvement:No   Subjective:   Luis Villa participated from car, via video, and consented to treatment. I discussed the limitations of evaluation and management by telemedicine and the availability of in person appointments. The patient expressed understanding and agreed to proceed.  Therapist participated from home office.  Luis Villa reviewed the events of the past week.   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 Patient described the other group members as welcoming, encouraging, and supportive. We discussed creating a plan for remaining sober, and patient noted that fishing is the only time the patients mind shuts down.   We updated the patient's treatment plan goals to reflect the following: Psych-education regarding the Keithon's diagnosis of Substance dependence, daily use was provided during the session.  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 Patient will remain sober for today Patient will keep a strong emphasis on his faith.  Patient will continue to develop more effective communication skills.  Patient will develop additional coping skills to be more available to his family. Patient will keep a strong emphasis on his faith. Patient will be aware of what he is thinking, how he is feeling, how he is communicating with his wife,and other family members.   Interventions: Cognitive Behavioral Therapy  Diagnosis: Substance dependence, daily use (HCC) F19.20  Psychiatric Treatment: No   Treatment Plan:  Client Abilities/Strengths Luis Villa is intelligent and committed to keeping his priorities straight.   Support System: Family  Client Treatment Preferences OPT  Client Statement of Needs Luis Villa would like to learn coping skills to manage his stress, depression and substance dependence   Treatment Level Weekly  Symptoms  Les experiences symptoms of substance dependence, daily use.  Goals:   Target Date: 1.24.26 Frequency: Weekly  Progress: 0 Modality: individual    Therapist will provide referrals for additional resources as appropriate.  Therapist will provide psycho-education regarding Xachary's diagnosis and corresponding treatment approaches and interventions. Jenkins CHRISTELLA Nicolas will support the patient's ability to achieve the goals identified. will employ CBT, BA, Problem-solving, Solution Focused, Mindfulness,  coping skills, & other evidenced-based practices will be used to promote progress towards healthy functioning to help manage decrease symptoms associated with their diagnosis.   Reduce overall level, frequency, and intensity of the feelings of depression, anxiety and panic evidenced by decreased overall symptoms from 6 to 7 days/week to 0 to 1 days/week per client report for at least 3 consecutive months. Verbally express understanding of the relationship  between feelings of depression, stress, and  substance depress and their impact on thinking patterns and behaviors.  Verbalize an understanding of the role that distorted thinking plays in creating fears, excessive worry, and ruminations.  [name] participated in the creation of the treatment plan)     Jenkins CHRISTELLA Nicolas

## 2023-11-15 ENCOUNTER — Other Ambulatory Visit: Payer: Self-pay | Admitting: Family Medicine

## 2023-11-15 ENCOUNTER — Ambulatory Visit (INDEPENDENT_AMBULATORY_CARE_PROVIDER_SITE_OTHER): Admitting: Psychology

## 2023-11-15 DIAGNOSIS — F1920 Other psychoactive substance dependence, uncomplicated: Secondary | ICD-10-CM

## 2023-11-15 NOTE — Progress Notes (Signed)
 [facility] Counselor/Therapist Progress Note  Patient ID: Cleophus Mendonsa, MRN: [id number]    Date: 11/15/23  Time Spent: 11:00 am - 11:53 am :53 minutes    Treatment Type: Individual Therapy.  Reported Symptoms: Patient was concerned about continuing his positive behavior   Mental Status Exam: Appearance:  Well Groomed     Behavior: Appropriate  Motor: Normal  Speech/Language:  Clear and Coherent  Affect: Appropriate  Mood: normal  Thought process: normal  Thought content:   WNL  Sensory/Perceptual disturbances:   WNL  Orientation: oriented to person, place, and time/date  Attention: Good  Concentration: Good  Memory: WNL  Fund of knowledge:  Good  Insight:   Good  Judgment:  Good  Impulse Control: Good   Risk Assessment: Danger to Self:  No Self-injurious Behavior: No Danger to Others: No Duty to Warn:no Physical Aggression / Violence:No  Access to Firearms a concern: No  Gang Involvement:No   Subjective:   [name] participated from car, via video, and consented to treatment. I discussed the limitations of evaluation and management by telemedicine and the availability of in person appointments. The patient expressed understanding and agreed to proceed.  Therapist participated from home office.  Jabes reviewed the events of the past week.   Isaish talked positively about the support group he attended on Sunday nights for the second time. Patient acknowledged that he had signed the updated treatment plan from last week, and was committed to achieving those goals.   Interventions: Cognitive Behavioral Therapy  Diagnosis:  Substance dependence, daily use (HCC) F19.20  Psychiatric Treatment: No   Treatment Plan:  Client Abilities/Strengths Tadan is intelligent and committed to keeping his priorities straight.  Support System: Family  Client Treatment Preferences OPT  Client Statement of Needs Willian would like to learn coping skills to  manage his stress, depression, and substance dependence.    Treatment Level Weekly  Symptoms  Lige experiences symptoms of substance dependence, daily use  Goals:   Target Date: 1.24.26 Frequency: Weekly  Progress: 10% Modality: individual    Therapist will provide referrals for additional resources as appropriate.  Therapist will provide psycho-education regarding Mansa's diagnosis and corresponding treatment approaches and interventions. Jenkins CHRISTELLA Nicolas will support the patient's ability to achieve the goals identified. will employ CBT, BA, Problem-solving, Solution Focused, Mindfulness,  coping skills, & other evidenced-based practices will be used to promote progress towards healthy functioning to help manage decrease symptoms associated with their diagnosis.   Reduce overall level, frequency, and intensity of the feelings of depression, anxiety and panic evidenced by decreased overall symptoms from 6 to 7 days/week to 0 to 1 days/week per client report for at least 3 consecutive months. Verbally express understanding of the relationship between feelings of substance dependence and their impact on thinking patterns and behaviors. Verbalize an understanding of the role that distorted thinking plays in creating fears, excessive worry, and ruminations.  [name] participated in the creation of the treatment plan)    Jenkins CHRISTELLA Nicolas

## 2023-11-22 ENCOUNTER — Other Ambulatory Visit (HOSPITAL_BASED_OUTPATIENT_CLINIC_OR_DEPARTMENT_OTHER): Payer: Self-pay | Admitting: Family Medicine

## 2023-11-22 ENCOUNTER — Ambulatory Visit (INDEPENDENT_AMBULATORY_CARE_PROVIDER_SITE_OTHER): Admitting: Psychology

## 2023-11-22 DIAGNOSIS — F1920 Other psychoactive substance dependence, uncomplicated: Secondary | ICD-10-CM | POA: Diagnosis not present

## 2023-11-22 DIAGNOSIS — Z8249 Family history of ischemic heart disease and other diseases of the circulatory system: Secondary | ICD-10-CM

## 2023-11-22 NOTE — Progress Notes (Signed)
 Luis Villa Progress Note  Patient ID: Luis Villa, MRN: [id number]    Date: 11/22/23  Time Spent: 11:00 am - 11:54 am : 54 minutes   Treatment Type: Individual Therapy.  Reported Symptoms: Patient was concerned about continuing his positive behavior.  Mental Status Exam: Appearance:  Well Groomed     Behavior: Appropriate  Motor: Normal  Speech/Language:  Normal Rate  Affect: Appropriate  Mood: normal  Thought process: normal  Thought content:   WNL  Sensory/Perceptual disturbances:   WNL  Orientation: oriented to person, place, and time/date  Attention: Good  Concentration: Good  Memory: WNL  Fund of knowledge:  Good  Insight:   Good  Judgment:  Good  Impulse Control: Good   Risk Assessment: Danger to Self:  No Self-injurious Behavior: No Danger to Others: No Duty to Warn:no Physical Aggression / Violence:No  Access to Firearms a concern: No  Gang Involvement:No   Subjective:   Luis Villa participated from car, via video, and consented to treatment. I discussed the limitations of evaluation and management by telemedicine and the availability of in person appointments. The patient expressed understanding and agreed to proceed. Therapist participated from home office.  Luis Villa reviewed the events of the past week.   Patient got through a fishing trip without his crutch.  A different Luis Villa, a Research scientist (medical), for the [employer], lives in [place], connected with patient Luis Villa. Best friend, Luis Villa, is a IT sales professional, and an EMT, and a recovering alcoholic, lives in [place]. Luis Villa hasn't told him yet about his challenges, but he will this weekend. Downside is patient having to tell my story again. Upside is that Luis Villa has been there and done that.   Interventions: Cognitive Behavioral Therapy  Diagnosis: Substance dependence, daily use (HCC) F19.20   Psychiatric Treatment: No   Treatment  Plan:  Client Abilities/Strengths Luis Villa is intelligent and committed to keeping his priorities straight.   Support System: Family  Client Treatment Preferences OPT  Client Statement of Needs Luis Villa would like to learn coping skills to manage his stress, depression, and substance dependence.    Treatment Level Weekly  Symptoms  [name] experiences symptoms of substance dependence, daily use  Goals:   Target Date: 1.24.26 Frequency: Weekly  Progress: 25% Modality: individual    Therapist will provide referrals for additional resources as appropriate.  Therapist will provide psycho-education regarding Luis Villa's diagnosis and corresponding treatment approaches and interventions. Luis Villa will support the patient's ability to achieve the goals identified. will employ CBT, BA, Problem-solving, Solution Focused, Mindfulness,  coping skills, & other evidenced-based practices will be used to promote progress towards healthy functioning to help manage decrease symptoms associated with their diagnosis.   Reduce overall level, frequency, and intensity of the feelings of depression, anxiety and panic evidenced by decreased overall symptoms from 6 to 7 days/week to 0 to 1 days/week per client report for at least 3 consecutive months. Verbally express understanding of the relationship between feelings of substance dependence and its impact on thinking patterns and behaviors. Verbalize an understanding of the role that distorted thinking plays in creating fears, excessive worry, and ruminations.  Luis Villa participated in the creation of the treatment plan)    Luis Villa

## 2023-11-29 ENCOUNTER — Ambulatory Visit: Admitting: Psychology

## 2023-11-29 DIAGNOSIS — F1920 Other psychoactive substance dependence, uncomplicated: Secondary | ICD-10-CM

## 2023-11-29 NOTE — Progress Notes (Signed)
 [facility] Counselor/Therapist Progress Note  Patient ID: Luis Villa, MRN: [id number]    Date: 11/29/23  Time Spent: 11:00 am - 11:55 am : 55 minutes     Treatment Type: Individual Therapy.  Reported Symptoms: Patient was concerned about continuing his positive behavior.   Mental Status Exam: Appearance:  Casual     Behavior: Appropriate  Motor: Normal  Speech/Language:  Normal Rate  Affect: Appropriate  Mood: anxious  Thought process: normal  Thought content:   WNL  Sensory/Perceptual disturbances:   WNL  Orientation: oriented to person, place, and time/date  Attention: Good  Concentration: Good  Memory: WNL  Fund of knowledge:  Good  Insight:   Good  Judgment:  Good  Impulse Control: Good   Risk Assessment: Danger to Self:  No Self-injurious Behavior: No Danger to Others: No Duty to Warn:no Physical Aggression / Violence:No  Access to Firearms a concern: No  Gang Involvement:No   Subjective:   Luis Villa participated from home, via video, and consented to treatment. I discussed the limitations of evaluation and management by telemedicine and the availability of in person appointments. The patient expressed understanding and agreed to proceed.  Therapist participated from home office.  Luis Villa reviewed the events of the past week.   Patient reported that he had 36 days of a much more positive attitude.  t's 39 year old daughter got baptized yesterday and patient described it as amazing. Pt reported that his parents and his wife are still walking on egg shells to a certain extent. Patient has missed his support group for two weeks. Patient plans ahead while at the same time trying to take one day ahead of time.   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   Support System: Family  Client Treatment Preferences OPT  Client Statement of Needs Luis Villa would like to learn coping skills to manage his stress, depression, and substance dependence.    Treatment Level Weekly  Symptoms  Luis Villa experiences symptoms of substance dependence, daily use.   Goals:   Target Date: 1.24.26 Frequency: Weekly  Progress: 30% Modality: individual    Therapist will provide referrals for additional resources as appropriate.  Therapist will provide psycho-education regarding Pauline's diagnosis and corresponding treatment approaches and interventions. Jenkins CHRISTELLA Nicolas will support the patient's ability to achieve the goals identified. will employ CBT, BA, Problem-solving, Solution Focused, Mindfulness,  coping skills, & other evidenced-based practices will be used to promote progress towards healthy functioning to help manage decrease symptoms associated with their diagnosis.   Reduce overall level, frequency, and intensity of the feelings of depression, anxiety and panic evidenced by decreased overall symptoms from 6 to 7 days/week to 0 to 1 days/week per client report for at least 3 consecutive months. Verbally express understanding of the relationship between feelings of substance dependence, depression, anxiety and their impact on thinking patterns and behaviors. Verbalize an understanding of the role that distorted thinking plays in creating fears, excessive worry, and ruminations.  [name] participated in the creation of the treatment plan)    Jenkins CHRISTELLA Nicolas

## 2023-12-10 ENCOUNTER — Ambulatory Visit: Admitting: Psychology

## 2023-12-24 ENCOUNTER — Other Ambulatory Visit: Payer: Self-pay | Admitting: Family Medicine

## 2024-01-03 ENCOUNTER — Ambulatory Visit (HOSPITAL_BASED_OUTPATIENT_CLINIC_OR_DEPARTMENT_OTHER)
Admission: RE | Admit: 2024-01-03 | Discharge: 2024-01-03 | Disposition: A | Payer: Self-pay | Source: Ambulatory Visit | Attending: Family Medicine | Admitting: Family Medicine

## 2024-01-03 DIAGNOSIS — Z8249 Family history of ischemic heart disease and other diseases of the circulatory system: Secondary | ICD-10-CM

## 2024-01-31 ENCOUNTER — Other Ambulatory Visit: Payer: Self-pay | Admitting: Family Medicine

## 2024-02-07 ENCOUNTER — Other Ambulatory Visit: Payer: Self-pay

## 2024-02-07 MED ORDER — TESTOSTERONE CYPIONATE 200 MG/ML IM SOLN: 150.0000 mg | INTRAMUSCULAR | 0 refills | Status: AC

## 2024-02-07 NOTE — Addendum Note (Signed)
 Addended by: MICHEAL WOLM ORN on: 02/07/2024 05:08 PM   Modules accepted: Orders

## 2024-02-07 NOTE — Addendum Note (Signed)
 Addended by: METTA KRISTEN CROME on: 02/07/2024 01:34 PM   Modules accepted: Orders

## 2024-02-07 NOTE — Telephone Encounter (Signed)
 Copied from CRM [id number]. Topic: Clinical - Prescription Issue >> Feb 07, 2024  9:29 AM Carlyon D wrote: Reason for CRM: Stuart from [facility] calling in regards to pt medication  testosterone  cypionate (DEPOTESTOTERONE CYPIONATE) 100 MG/ML injection is on back order but pharmacist states they have the 200 mg/ml available and if they can fill that for pt.  Mariea at [facility] call back #  [phone]

## 2024-02-07 NOTE — Telephone Encounter (Addendum)
 I spoke with Stuart at [facility] and she reported she cannot take verbal authorization since prescription is controlled and would need new prescription to be called in. New rx pended for review

## 2024-02-18 ENCOUNTER — Other Ambulatory Visit: Payer: Self-pay | Admitting: Family Medicine

## 2024-02-18 ENCOUNTER — Encounter: Payer: Self-pay | Admitting: Family Medicine

## 2024-02-18 MED ORDER — AMPHETAMINE-DEXTROAMPHET ER 30 MG PO CP24: 30.0000 mg | ORAL_CAPSULE | Freq: Every morning | ORAL | 0 refills | Status: AC

## 2024-02-18 MED ORDER — AMPHETAMINE-DEXTROAMPHET ER 30 MG PO CP24: 30.0000 mg | ORAL_CAPSULE | ORAL | 0 refills | Status: AC
